# Patient Record
Sex: Female | Born: 1950 | Race: White | Hispanic: No | Marital: Single | Smoking: Never smoker
Health system: Southern US, Community
[De-identification: ages and names within clinical notes are randomized; demographics above are authoritative.]

## PROBLEM LIST (undated history)

## (undated) HISTORY — PX: BUNIONECTOMY: SHX129

## (undated) HISTORY — PX: GANGLION CYST EXCISION: SHX1691

## (undated) HISTORY — PX: LAMINECTOMY: SHX219

## (undated) HISTORY — PX: ABDOMINAL HYSTERECTOMY: SHX81

---

## 1999-09-25 ENCOUNTER — Other Ambulatory Visit: Admission: RE | Admit: 1999-09-25 | Discharge: 1999-09-25 | Payer: Self-pay | Admitting: Gynecology

## 2000-11-27 ENCOUNTER — Other Ambulatory Visit: Admission: RE | Admit: 2000-11-27 | Discharge: 2000-11-27 | Payer: Self-pay | Admitting: Gynecology

## 2001-11-30 ENCOUNTER — Other Ambulatory Visit: Admission: RE | Admit: 2001-11-30 | Discharge: 2001-11-30 | Payer: Self-pay | Admitting: Gynecology

## 2002-12-02 ENCOUNTER — Other Ambulatory Visit: Admission: RE | Admit: 2002-12-02 | Discharge: 2002-12-02 | Payer: Self-pay | Admitting: Gynecology

## 2003-12-06 ENCOUNTER — Other Ambulatory Visit: Admission: RE | Admit: 2003-12-06 | Discharge: 2003-12-06 | Payer: Self-pay | Admitting: Gynecology

## 2005-02-11 ENCOUNTER — Other Ambulatory Visit: Admission: RE | Admit: 2005-02-11 | Discharge: 2005-02-11 | Payer: Self-pay | Admitting: Gynecology

## 2005-09-16 ENCOUNTER — Ambulatory Visit (HOSPITAL_COMMUNITY): Admission: RE | Admit: 2005-09-16 | Discharge: 2005-09-17 | Payer: Self-pay | Admitting: Neurosurgery

## 2014-09-14 ENCOUNTER — Other Ambulatory Visit: Payer: Self-pay | Admitting: Unknown Physician Specialty

## 2014-09-14 DIAGNOSIS — Z139 Encounter for screening, unspecified: Secondary | ICD-10-CM

## 2014-09-14 DIAGNOSIS — K5900 Constipation, unspecified: Secondary | ICD-10-CM

## 2014-09-29 ENCOUNTER — Other Ambulatory Visit: Payer: Self-pay

## 2014-10-04 ENCOUNTER — Ambulatory Visit
Admission: RE | Admit: 2014-10-04 | Discharge: 2014-10-04 | Disposition: A | Discharge status: Home / Self Care | Payer: PRIVATE HEALTH INSURANCE | Source: Ambulatory Visit | Attending: Unknown Physician Specialty | Admitting: Unknown Physician Specialty

## 2014-10-04 DIAGNOSIS — K5900 Constipation, unspecified: Secondary | ICD-10-CM

## 2016-02-09 DIAGNOSIS — K219 Gastro-esophageal reflux disease without esophagitis: Secondary | ICD-10-CM

## 2016-02-09 DIAGNOSIS — E039 Hypothyroidism, unspecified: Secondary | ICD-10-CM

## 2016-02-09 DIAGNOSIS — K581 Irritable bowel syndrome with constipation: Secondary | ICD-10-CM

## 2016-02-09 DIAGNOSIS — M8580 Other specified disorders of bone density and structure, unspecified site: Secondary | ICD-10-CM

## 2016-02-09 DIAGNOSIS — F329 Major depressive disorder, single episode, unspecified: Secondary | ICD-10-CM

## 2016-02-09 DIAGNOSIS — I10 Essential (primary) hypertension: Secondary | ICD-10-CM

## 2016-02-09 HISTORY — DX: Gastro-esophageal reflux disease without esophagitis: K21.9

## 2016-02-09 HISTORY — DX: Major depressive disorder, single episode, unspecified: F32.9

## 2016-02-09 HISTORY — DX: Other specified disorders of bone density and structure, unspecified site: M85.80

## 2016-02-09 HISTORY — DX: Irritable bowel syndrome with constipation: K58.1

## 2016-02-09 HISTORY — DX: Essential (primary) hypertension: I10

## 2016-02-09 HISTORY — DX: Hypothyroidism, unspecified: E03.9

## 2016-04-05 DIAGNOSIS — I700 Atherosclerosis of aorta: Secondary | ICD-10-CM

## 2016-04-05 HISTORY — DX: Atherosclerosis of aorta: I70.0

## 2017-04-08 DIAGNOSIS — M67471 Ganglion, right ankle and foot: Secondary | ICD-10-CM

## 2017-04-08 HISTORY — DX: Ganglion, right ankle and foot: M67.471

## 2017-12-15 DIAGNOSIS — D485 Neoplasm of uncertain behavior of skin: Secondary | ICD-10-CM | POA: Insufficient documentation

## 2017-12-15 HISTORY — DX: Neoplasm of uncertain behavior of skin: D48.5

## 2018-02-02 DIAGNOSIS — I7789 Other specified disorders of arteries and arterioles: Secondary | ICD-10-CM

## 2018-02-02 DIAGNOSIS — Z0000 Encounter for general adult medical examination without abnormal findings: Secondary | ICD-10-CM | POA: Diagnosis not present

## 2018-02-02 DIAGNOSIS — R5383 Other fatigue: Secondary | ICD-10-CM | POA: Diagnosis not present

## 2018-02-02 DIAGNOSIS — K219 Gastro-esophageal reflux disease without esophagitis: Secondary | ICD-10-CM | POA: Diagnosis not present

## 2018-02-02 DIAGNOSIS — R5381 Other malaise: Secondary | ICD-10-CM | POA: Diagnosis not present

## 2018-02-02 DIAGNOSIS — I10 Essential (primary) hypertension: Secondary | ICD-10-CM | POA: Diagnosis not present

## 2018-02-02 DIAGNOSIS — K581 Irritable bowel syndrome with constipation: Secondary | ICD-10-CM | POA: Diagnosis not present

## 2018-02-02 DIAGNOSIS — I700 Atherosclerosis of aorta: Secondary | ICD-10-CM | POA: Diagnosis not present

## 2018-02-02 DIAGNOSIS — E039 Hypothyroidism, unspecified: Secondary | ICD-10-CM | POA: Diagnosis not present

## 2018-02-02 HISTORY — DX: Other specified disorders of arteries and arterioles: I77.89

## 2018-02-06 DIAGNOSIS — R8290 Unspecified abnormal findings in urine: Secondary | ICD-10-CM | POA: Diagnosis not present

## 2018-02-06 DIAGNOSIS — I700 Atherosclerosis of aorta: Secondary | ICD-10-CM | POA: Diagnosis not present

## 2018-02-17 DIAGNOSIS — M889 Osteitis deformans of unspecified bone: Secondary | ICD-10-CM

## 2018-02-17 HISTORY — DX: Osteitis deformans of unspecified bone: M88.9

## 2018-02-19 DIAGNOSIS — Z7982 Long term (current) use of aspirin: Secondary | ICD-10-CM

## 2018-02-19 DIAGNOSIS — E782 Mixed hyperlipidemia: Secondary | ICD-10-CM

## 2018-02-19 DIAGNOSIS — Z1231 Encounter for screening mammogram for malignant neoplasm of breast: Secondary | ICD-10-CM | POA: Diagnosis not present

## 2018-02-19 DIAGNOSIS — R079 Chest pain, unspecified: Secondary | ICD-10-CM | POA: Diagnosis not present

## 2018-02-19 DIAGNOSIS — I10 Essential (primary) hypertension: Secondary | ICD-10-CM | POA: Diagnosis not present

## 2018-02-19 HISTORY — DX: Long term (current) use of aspirin: Z79.82

## 2018-02-19 HISTORY — DX: Mixed hyperlipidemia: E78.2

## 2018-02-20 DIAGNOSIS — R079 Chest pain, unspecified: Secondary | ICD-10-CM | POA: Diagnosis not present

## 2018-03-03 DIAGNOSIS — Z7982 Long term (current) use of aspirin: Secondary | ICD-10-CM | POA: Diagnosis not present

## 2018-03-03 DIAGNOSIS — E782 Mixed hyperlipidemia: Secondary | ICD-10-CM | POA: Diagnosis not present

## 2018-03-03 DIAGNOSIS — R079 Chest pain, unspecified: Secondary | ICD-10-CM | POA: Diagnosis not present

## 2018-03-03 DIAGNOSIS — I10 Essential (primary) hypertension: Secondary | ICD-10-CM | POA: Diagnosis not present

## 2018-03-19 DIAGNOSIS — R079 Chest pain, unspecified: Secondary | ICD-10-CM | POA: Diagnosis not present

## 2018-03-19 DIAGNOSIS — Z7982 Long term (current) use of aspirin: Secondary | ICD-10-CM | POA: Diagnosis not present

## 2018-03-19 DIAGNOSIS — I10 Essential (primary) hypertension: Secondary | ICD-10-CM | POA: Diagnosis not present

## 2018-03-19 DIAGNOSIS — E782 Mixed hyperlipidemia: Secondary | ICD-10-CM | POA: Diagnosis not present

## 2018-03-26 DIAGNOSIS — M889 Osteitis deformans of unspecified bone: Secondary | ICD-10-CM | POA: Diagnosis not present

## 2018-03-26 DIAGNOSIS — E559 Vitamin D deficiency, unspecified: Secondary | ICD-10-CM | POA: Diagnosis not present

## 2018-03-26 DIAGNOSIS — R5383 Other fatigue: Secondary | ICD-10-CM | POA: Diagnosis not present

## 2018-03-26 DIAGNOSIS — M810 Age-related osteoporosis without current pathological fracture: Secondary | ICD-10-CM | POA: Diagnosis not present

## 2018-04-01 DIAGNOSIS — M881 Osteitis deformans of vertebrae: Secondary | ICD-10-CM | POA: Diagnosis not present

## 2018-04-01 DIAGNOSIS — M8580 Other specified disorders of bone density and structure, unspecified site: Secondary | ICD-10-CM | POA: Diagnosis not present

## 2018-04-01 DIAGNOSIS — M889 Osteitis deformans of unspecified bone: Secondary | ICD-10-CM | POA: Diagnosis not present

## 2018-04-01 DIAGNOSIS — R937 Abnormal findings on diagnostic imaging of other parts of musculoskeletal system: Secondary | ICD-10-CM | POA: Diagnosis not present

## 2018-07-09 DIAGNOSIS — E039 Hypothyroidism, unspecified: Secondary | ICD-10-CM | POA: Diagnosis not present

## 2018-07-09 DIAGNOSIS — I10 Essential (primary) hypertension: Secondary | ICD-10-CM | POA: Diagnosis not present

## 2018-07-09 DIAGNOSIS — K219 Gastro-esophageal reflux disease without esophagitis: Secondary | ICD-10-CM | POA: Diagnosis not present

## 2018-07-09 DIAGNOSIS — M8589 Other specified disorders of bone density and structure, multiple sites: Secondary | ICD-10-CM | POA: Diagnosis not present

## 2018-07-27 DIAGNOSIS — E041 Nontoxic single thyroid nodule: Secondary | ICD-10-CM | POA: Diagnosis not present

## 2018-07-27 DIAGNOSIS — E2839 Other primary ovarian failure: Secondary | ICD-10-CM | POA: Diagnosis not present

## 2018-07-27 DIAGNOSIS — M85851 Other specified disorders of bone density and structure, right thigh: Secondary | ICD-10-CM | POA: Diagnosis not present

## 2018-07-27 DIAGNOSIS — E039 Hypothyroidism, unspecified: Secondary | ICD-10-CM | POA: Diagnosis not present

## 2018-08-06 ENCOUNTER — Other Ambulatory Visit: Payer: Self-pay | Admitting: *Deleted

## 2018-08-06 NOTE — Patient Outreach (Signed)
[place] [facility]) Care Management  08/06/2018  ARMONEE BOJANOWSKI Nov 18, 1951 [id number]  Initial unsuccessful outreach to member to complete Health [employer] health risk assessment screening. Left message on home contact number requesting return call.   Barrington Ellison RN,CCM,CDE [place] Management Coordinator Office Phone [phone] Office Fax [phone]

## 2018-08-07 ENCOUNTER — Other Ambulatory Visit: Payer: Self-pay | Admitting: *Deleted

## 2018-08-07 NOTE — Patient Outreach (Signed)
[place] [facility]) Care Management  08/07/2018  Wendy Diaz 01/07/1951 [id number]   Returned call to member in attempt to complete [employer] Heath risk assessment, no answer and not able to leave voice mail at number provided by member.  Barrington Ellison RN,CCM,CDE [place] Management Coordinator Office Phone [phone] Office Fax [phone]

## 2018-08-07 NOTE — Patient Outreach (Signed)
[place] [facility]) Care Management  08/07/2018  Wendy Diaz 1951-06-07 [id number]   Member returned call and [facility] Team [employer]age health risk assessment answers reviewed with [place]. She states she was recently diagnosed with hypertension. She reports chronic fatigue since starting hypertension medications of amlodipine and losartan in March.  She also states she had to change to generic levothyroxine to treat her chronic hypothyroidism because she could not afford the copay for Synthroid under her current [employer] plan. She wonders if the change to generic thyroid medication is contributing to her fatigue even though her most recent TSH was normal.  She states she monitors her blood pressure at least daily and sometimes twice daily. She provided 3 of the most recent readings; 134/71, pulse = 59, 131/70, pulse 69 and 130/7- pulse 56. Encouraged her to contact her provider to discuss change in therapy.  She also says she has never been told her blood pressure targets. She declined th offer of a health coach for HTN at this time.  She says she has chronic vertigo and she thinks the blood pressure medicines have exacerbated the vertigo.  She also relates chronic bladder incontinence  that has worsened since her hysterectomy. She says she had a bladder tack years ago and that did not help. She says she wears incontinence briefs.  She says she thinks she may be depressed as her Dad died and she recently placed her Mom in a facility due to Alzheimer's Disease. She says she would like information about the licensed clinical social worker Warden/ranger) services provided by [employer]. Verified home address; will send successful outreach letter with [place] Management program brochure and 24 hr Nurse Line magnet and Emmi modules on hypertension and checking your blood pressure at home and blood pressure medicines and depression. Will also send  information on percutaneous tibial nerve stimulation to treat urinary incontinence.    Barrington Ellison RN,CCM,CDE [place] Management Coordinator Office Phone [phone] Office Fax [phone]

## 2018-08-07 NOTE — Patient Outreach (Signed)
[place] [facility]) Care Management  08/07/2018  Wendy Diaz [birth year] [id number]   Patient returned call in response to phone message left on 8/15 by this RNCM. She left voice mail requesting return call.  Barrington Ellison RN,CCM,CDE [place] Management Coordinator Office Phone [phone] Office Fax [phone]

## 2018-10-21 DIAGNOSIS — R42 Dizziness and giddiness: Secondary | ICD-10-CM | POA: Diagnosis not present

## 2018-10-21 DIAGNOSIS — J209 Acute bronchitis, unspecified: Secondary | ICD-10-CM | POA: Diagnosis not present

## 2018-10-21 DIAGNOSIS — H8190 Unspecified disorder of vestibular function, unspecified ear: Secondary | ICD-10-CM

## 2018-10-21 HISTORY — DX: Unspecified disorder of vestibular function, unspecified ear: H81.90

## 2018-11-24 DIAGNOSIS — E039 Hypothyroidism, unspecified: Secondary | ICD-10-CM | POA: Diagnosis not present

## 2018-11-24 DIAGNOSIS — R42 Dizziness and giddiness: Secondary | ICD-10-CM | POA: Diagnosis not present

## 2018-11-24 DIAGNOSIS — F329 Major depressive disorder, single episode, unspecified: Secondary | ICD-10-CM | POA: Diagnosis not present

## 2018-11-24 DIAGNOSIS — E782 Mixed hyperlipidemia: Secondary | ICD-10-CM | POA: Diagnosis not present

## 2018-11-24 DIAGNOSIS — Z0000 Encounter for general adult medical examination without abnormal findings: Secondary | ICD-10-CM | POA: Diagnosis not present

## 2018-11-24 DIAGNOSIS — M8589 Other specified disorders of bone density and structure, multiple sites: Secondary | ICD-10-CM | POA: Diagnosis not present

## 2018-11-24 DIAGNOSIS — K219 Gastro-esophageal reflux disease without esophagitis: Secondary | ICD-10-CM | POA: Diagnosis not present

## 2018-11-24 DIAGNOSIS — I700 Atherosclerosis of aorta: Secondary | ICD-10-CM | POA: Diagnosis not present

## 2018-11-24 DIAGNOSIS — I10 Essential (primary) hypertension: Secondary | ICD-10-CM | POA: Diagnosis not present

## 2018-11-24 DIAGNOSIS — I7789 Other specified disorders of arteries and arterioles: Secondary | ICD-10-CM | POA: Diagnosis not present

## 2018-11-24 DIAGNOSIS — K581 Irritable bowel syndrome with constipation: Secondary | ICD-10-CM | POA: Diagnosis not present

## 2018-11-24 DIAGNOSIS — Z7982 Long term (current) use of aspirin: Secondary | ICD-10-CM | POA: Diagnosis not present

## 2018-11-24 DIAGNOSIS — E7800 Pure hypercholesterolemia, unspecified: Secondary | ICD-10-CM | POA: Diagnosis not present

## 2018-12-08 DIAGNOSIS — M545 Low back pain: Secondary | ICD-10-CM | POA: Diagnosis not present

## 2018-12-08 DIAGNOSIS — E559 Vitamin D deficiency, unspecified: Secondary | ICD-10-CM | POA: Diagnosis not present

## 2018-12-08 DIAGNOSIS — M889 Osteitis deformans of unspecified bone: Secondary | ICD-10-CM | POA: Diagnosis not present

## 2019-01-07 DIAGNOSIS — R42 Dizziness and giddiness: Secondary | ICD-10-CM | POA: Diagnosis not present

## 2019-01-07 DIAGNOSIS — H6121 Impacted cerumen, right ear: Secondary | ICD-10-CM | POA: Diagnosis not present

## 2019-01-07 DIAGNOSIS — J342 Deviated nasal septum: Secondary | ICD-10-CM | POA: Diagnosis not present

## 2019-01-07 DIAGNOSIS — H8110 Benign paroxysmal vertigo, unspecified ear: Secondary | ICD-10-CM | POA: Diagnosis not present

## 2019-03-08 DIAGNOSIS — R2681 Unsteadiness on feet: Secondary | ICD-10-CM | POA: Diagnosis not present

## 2019-03-08 DIAGNOSIS — R42 Dizziness and giddiness: Secondary | ICD-10-CM | POA: Diagnosis not present

## 2019-03-11 DIAGNOSIS — R2681 Unsteadiness on feet: Secondary | ICD-10-CM | POA: Diagnosis not present

## 2019-03-11 DIAGNOSIS — R42 Dizziness and giddiness: Secondary | ICD-10-CM | POA: Diagnosis not present

## 2019-03-18 DIAGNOSIS — R2681 Unsteadiness on feet: Secondary | ICD-10-CM | POA: Diagnosis not present

## 2019-03-18 DIAGNOSIS — R42 Dizziness and giddiness: Secondary | ICD-10-CM | POA: Diagnosis not present

## 2019-03-25 DIAGNOSIS — R2681 Unsteadiness on feet: Secondary | ICD-10-CM | POA: Diagnosis not present

## 2019-03-25 DIAGNOSIS — R42 Dizziness and giddiness: Secondary | ICD-10-CM | POA: Diagnosis not present

## 2019-04-08 DIAGNOSIS — R2681 Unsteadiness on feet: Secondary | ICD-10-CM | POA: Diagnosis not present

## 2019-04-08 DIAGNOSIS — R42 Dizziness and giddiness: Secondary | ICD-10-CM | POA: Diagnosis not present

## 2019-07-19 DIAGNOSIS — I10 Essential (primary) hypertension: Secondary | ICD-10-CM | POA: Diagnosis not present

## 2019-07-19 DIAGNOSIS — F329 Major depressive disorder, single episode, unspecified: Secondary | ICD-10-CM | POA: Diagnosis not present

## 2019-07-19 DIAGNOSIS — R7303 Prediabetes: Secondary | ICD-10-CM | POA: Diagnosis not present

## 2019-07-19 DIAGNOSIS — I700 Atherosclerosis of aorta: Secondary | ICD-10-CM | POA: Diagnosis not present

## 2019-07-19 DIAGNOSIS — Z7982 Long term (current) use of aspirin: Secondary | ICD-10-CM | POA: Diagnosis not present

## 2019-07-19 DIAGNOSIS — I7789 Other specified disorders of arteries and arterioles: Secondary | ICD-10-CM | POA: Diagnosis not present

## 2019-07-19 DIAGNOSIS — M8589 Other specified disorders of bone density and structure, multiple sites: Secondary | ICD-10-CM | POA: Diagnosis not present

## 2019-07-19 DIAGNOSIS — K219 Gastro-esophageal reflux disease without esophagitis: Secondary | ICD-10-CM | POA: Diagnosis not present

## 2019-07-19 DIAGNOSIS — K581 Irritable bowel syndrome with constipation: Secondary | ICD-10-CM | POA: Diagnosis not present

## 2019-07-19 DIAGNOSIS — E039 Hypothyroidism, unspecified: Secondary | ICD-10-CM | POA: Diagnosis not present

## 2019-07-19 DIAGNOSIS — E782 Mixed hyperlipidemia: Secondary | ICD-10-CM | POA: Diagnosis not present

## 2019-10-26 DIAGNOSIS — K5904 Chronic idiopathic constipation: Secondary | ICD-10-CM | POA: Diagnosis not present

## 2019-10-26 DIAGNOSIS — Z800 Family history of malignant neoplasm of digestive organs: Secondary | ICD-10-CM | POA: Diagnosis not present

## 2019-11-10 ENCOUNTER — Other Ambulatory Visit: Payer: Self-pay | Admitting: Unknown Physician Specialty

## 2019-11-10 DIAGNOSIS — Z800 Family history of malignant neoplasm of digestive organs: Secondary | ICD-10-CM

## 2019-11-26 DIAGNOSIS — K581 Irritable bowel syndrome with constipation: Secondary | ICD-10-CM | POA: Diagnosis not present

## 2019-11-26 DIAGNOSIS — E782 Mixed hyperlipidemia: Secondary | ICD-10-CM | POA: Diagnosis not present

## 2019-11-26 DIAGNOSIS — Z7982 Long term (current) use of aspirin: Secondary | ICD-10-CM | POA: Diagnosis not present

## 2019-11-26 DIAGNOSIS — Z0000 Encounter for general adult medical examination without abnormal findings: Secondary | ICD-10-CM | POA: Diagnosis not present

## 2019-11-26 DIAGNOSIS — F329 Major depressive disorder, single episode, unspecified: Secondary | ICD-10-CM | POA: Diagnosis not present

## 2019-11-26 DIAGNOSIS — I7789 Other specified disorders of arteries and arterioles: Secondary | ICD-10-CM | POA: Diagnosis not present

## 2019-11-26 DIAGNOSIS — I700 Atherosclerosis of aorta: Secondary | ICD-10-CM | POA: Diagnosis not present

## 2019-11-26 DIAGNOSIS — E039 Hypothyroidism, unspecified: Secondary | ICD-10-CM | POA: Diagnosis not present

## 2019-11-26 DIAGNOSIS — K219 Gastro-esophageal reflux disease without esophagitis: Secondary | ICD-10-CM | POA: Diagnosis not present

## 2019-11-26 DIAGNOSIS — I10 Essential (primary) hypertension: Secondary | ICD-10-CM | POA: Diagnosis not present

## 2019-11-30 ENCOUNTER — Ambulatory Visit
Admission: RE | Admit: 2019-11-30 | Discharge: 2019-11-30 | Disposition: A | Discharge status: Home / Self Care | Payer: PPO | Source: Ambulatory Visit | Attending: Unknown Physician Specialty | Admitting: Unknown Physician Specialty

## 2019-11-30 DIAGNOSIS — Z800 Family history of malignant neoplasm of digestive organs: Secondary | ICD-10-CM

## 2019-11-30 DIAGNOSIS — K8020 Calculus of gallbladder without cholecystitis without obstruction: Secondary | ICD-10-CM | POA: Diagnosis not present

## 2020-01-21 DIAGNOSIS — Z1231 Encounter for screening mammogram for malignant neoplasm of breast: Secondary | ICD-10-CM | POA: Diagnosis not present

## 2020-06-19 DIAGNOSIS — K581 Irritable bowel syndrome with constipation: Secondary | ICD-10-CM | POA: Diagnosis not present

## 2020-06-19 DIAGNOSIS — I700 Atherosclerosis of aorta: Secondary | ICD-10-CM | POA: Diagnosis not present

## 2020-06-19 DIAGNOSIS — Z7982 Long term (current) use of aspirin: Secondary | ICD-10-CM | POA: Diagnosis not present

## 2020-06-19 DIAGNOSIS — E039 Hypothyroidism, unspecified: Secondary | ICD-10-CM | POA: Diagnosis not present

## 2020-06-19 DIAGNOSIS — E782 Mixed hyperlipidemia: Secondary | ICD-10-CM | POA: Diagnosis not present

## 2020-06-19 DIAGNOSIS — K219 Gastro-esophageal reflux disease without esophagitis: Secondary | ICD-10-CM | POA: Diagnosis not present

## 2020-06-19 DIAGNOSIS — F329 Major depressive disorder, single episode, unspecified: Secondary | ICD-10-CM | POA: Diagnosis not present

## 2020-06-19 DIAGNOSIS — I10 Essential (primary) hypertension: Secondary | ICD-10-CM | POA: Diagnosis not present

## 2020-06-19 DIAGNOSIS — M25552 Pain in left hip: Secondary | ICD-10-CM | POA: Diagnosis not present

## 2020-06-20 DIAGNOSIS — H903 Sensorineural hearing loss, bilateral: Secondary | ICD-10-CM | POA: Diagnosis not present

## 2020-07-03 DIAGNOSIS — N3091 Cystitis, unspecified with hematuria: Secondary | ICD-10-CM | POA: Diagnosis not present

## 2020-07-03 DIAGNOSIS — N3000 Acute cystitis without hematuria: Secondary | ICD-10-CM | POA: Diagnosis not present

## 2020-07-04 DIAGNOSIS — H903 Sensorineural hearing loss, bilateral: Secondary | ICD-10-CM | POA: Diagnosis not present

## 2020-07-06 DIAGNOSIS — M160 Bilateral primary osteoarthritis of hip: Secondary | ICD-10-CM | POA: Diagnosis not present

## 2020-07-06 DIAGNOSIS — M7062 Trochanteric bursitis, left hip: Secondary | ICD-10-CM

## 2020-07-06 DIAGNOSIS — M47818 Spondylosis without myelopathy or radiculopathy, sacral and sacrococcygeal region: Secondary | ICD-10-CM | POA: Diagnosis not present

## 2020-07-06 DIAGNOSIS — M533 Sacrococcygeal disorders, not elsewhere classified: Secondary | ICD-10-CM | POA: Diagnosis not present

## 2020-07-06 HISTORY — DX: Trochanteric bursitis, left hip: M70.62

## 2020-07-28 DIAGNOSIS — Z780 Asymptomatic menopausal state: Secondary | ICD-10-CM | POA: Diagnosis not present

## 2020-07-28 DIAGNOSIS — M8589 Other specified disorders of bone density and structure, multiple sites: Secondary | ICD-10-CM | POA: Diagnosis not present

## 2020-08-29 DIAGNOSIS — J0100 Acute maxillary sinusitis, unspecified: Secondary | ICD-10-CM | POA: Diagnosis not present

## 2020-08-29 DIAGNOSIS — J309 Allergic rhinitis, unspecified: Secondary | ICD-10-CM | POA: Diagnosis not present

## 2020-09-14 IMAGING — CT CT VIRTUAL COLONOSCOPY SCREENING
2 of 9 series · 12 of 46 positions shown, 18 images · non-contrast
Comparison: 10/04/2049

CLINICAL DATA: Screening. Family history of colon cancer. Prior
hysterectomy.

EXAM:
CT VIRTUAL COLONOSCOPY SCREENING
TECHNIQUE: The patient was given a standard bowel preparation with Gastrografin
and barium for fluid and stool tagging respectively. The quality of
the bowel preparation is good. Automated CO2 insufflation of the
colon was performed prior to image acquisition and colonic
distention is moderate. Image post processing was used to generate a
3D endoluminal fly-through projection of the colon and to
electronically subtract stool/fluid as appropriate.

[Series 11: prone colon 1.50 br40 s3 prone thin · axial · 0.80mm/px · z∈[+1150,+1546]mm · 9 of 332 slices shown, 15 images]
[im 34/332  soft-tissue]
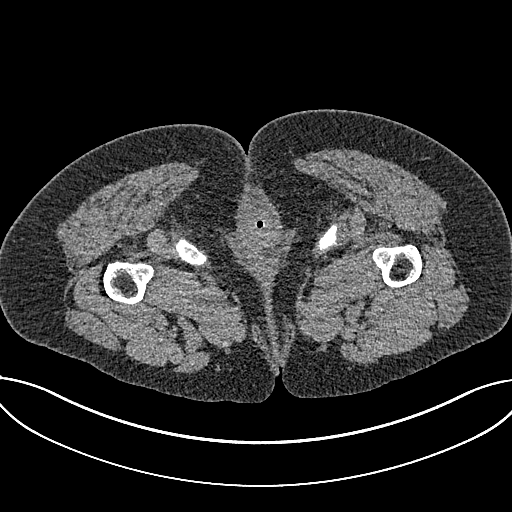
[im 34/332  bone]
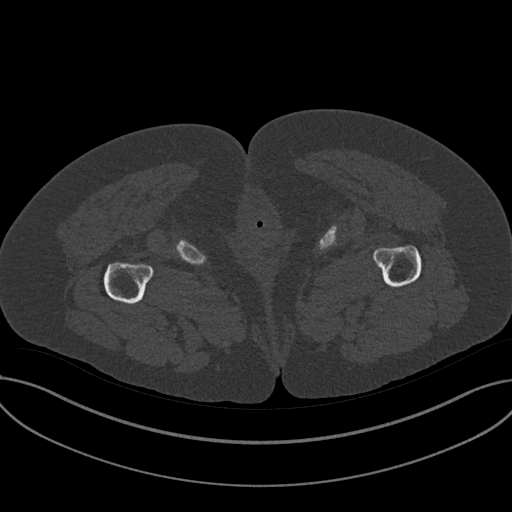
[im 67/332  soft-tissue]
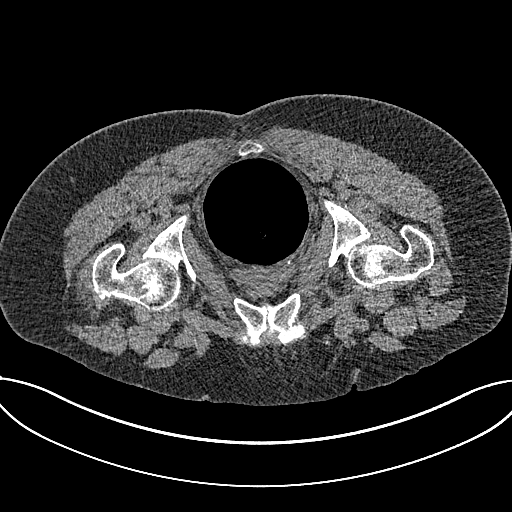
[im 100/332  soft-tissue]
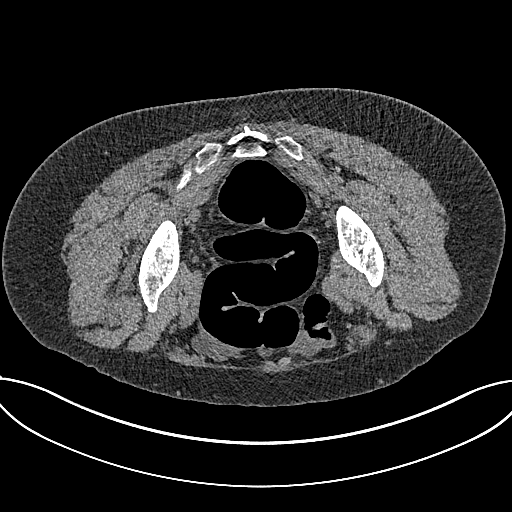
[im 133/332  soft-tissue]
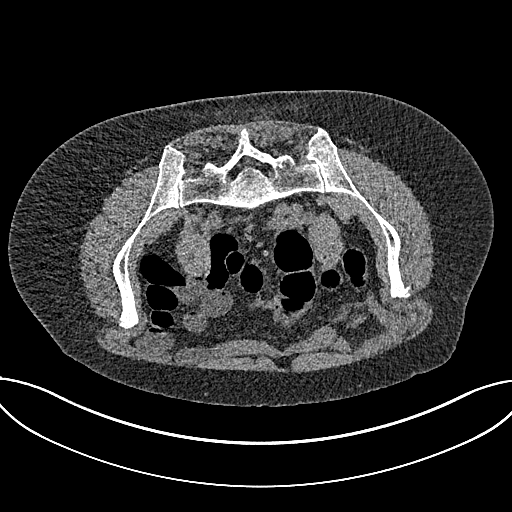
[im 166/332  soft-tissue]
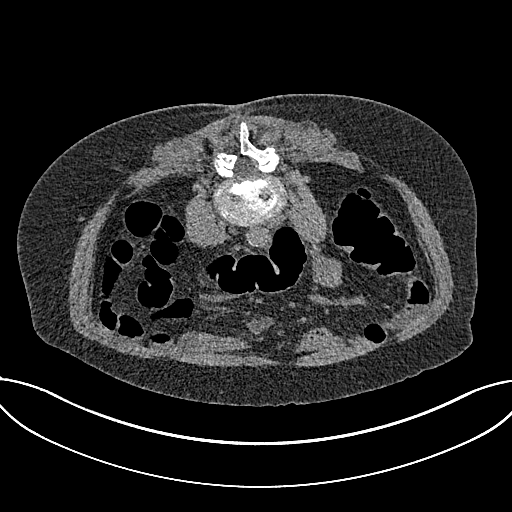
[im 199/332  soft-tissue]
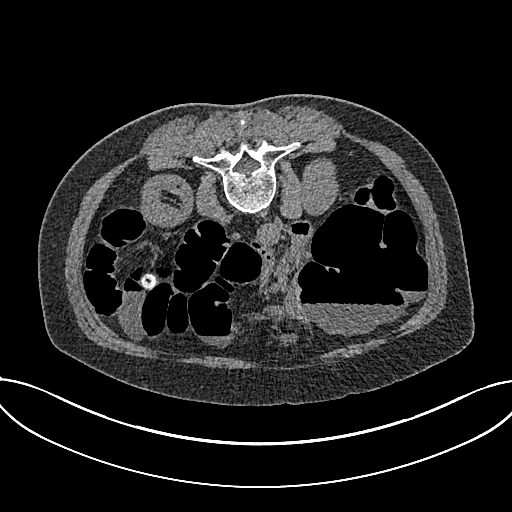
[im 199/332  lung]
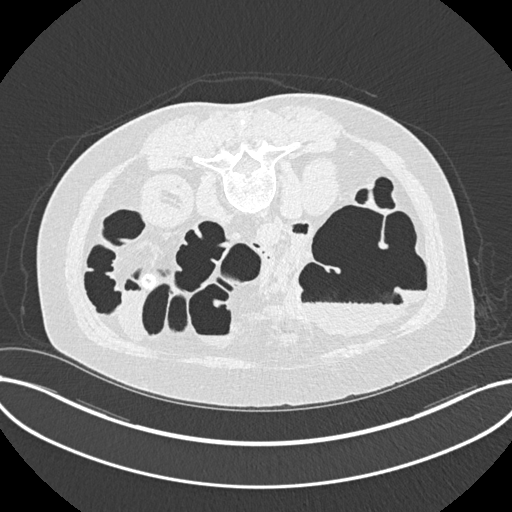
[im 232/332  soft-tissue]
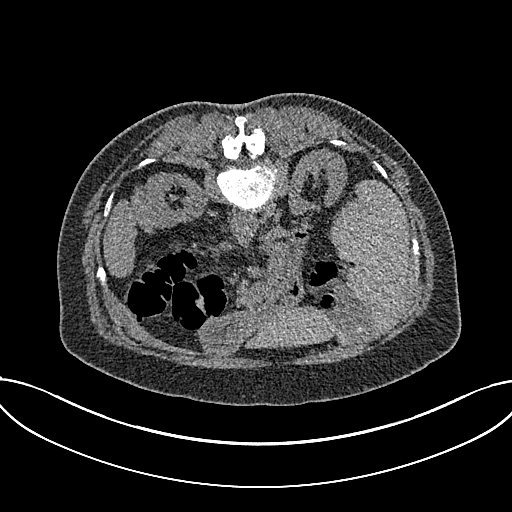
[im 232/332  lung]
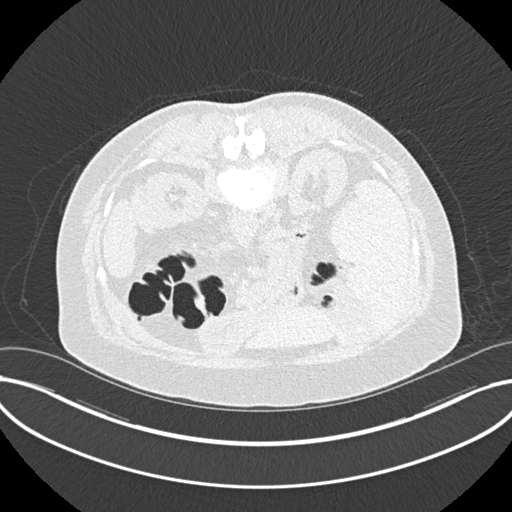
[im 265/332  soft-tissue]
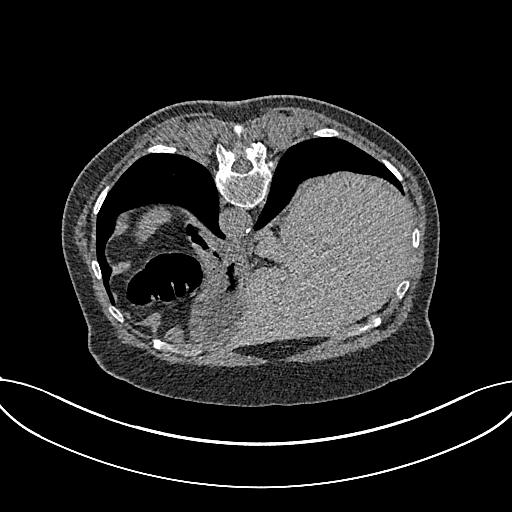
[im 265/332  lung]
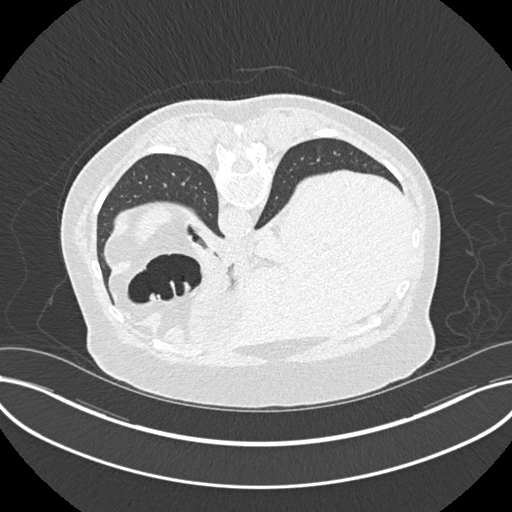
[im 298/332  soft-tissue]
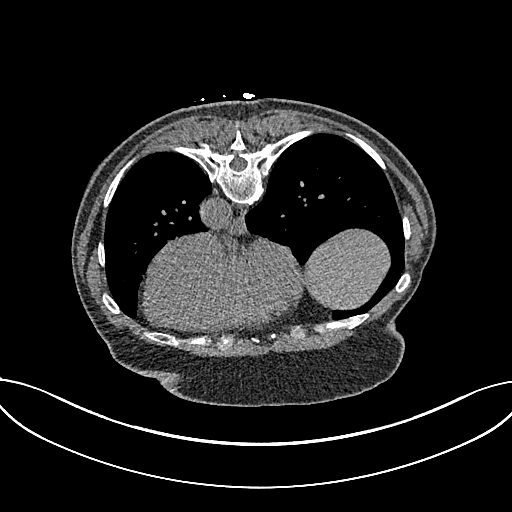
[im 298/332  lung]
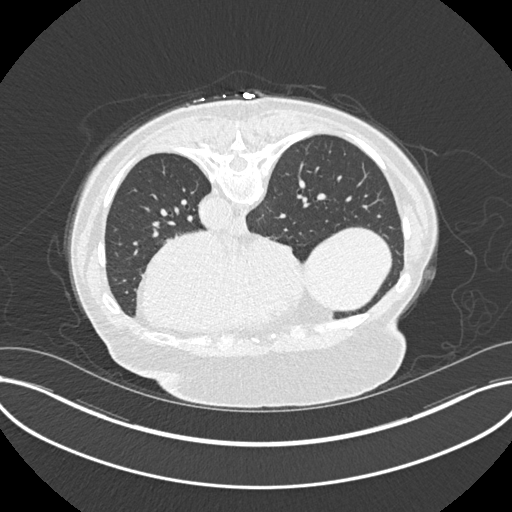
[im 298/332  bone]
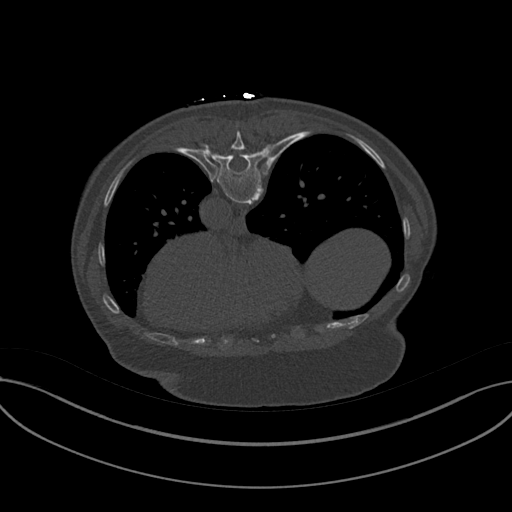

[Series 13: prone colon 3.00 br40 s3 cor prone · coronal · 0.80mm/px · 3 of 86 slices shown]
[im 22/86  soft-tissue]
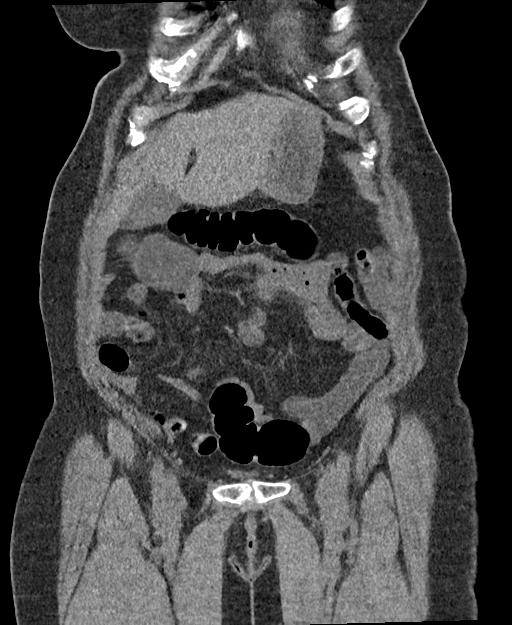
[im 43/86  soft-tissue]
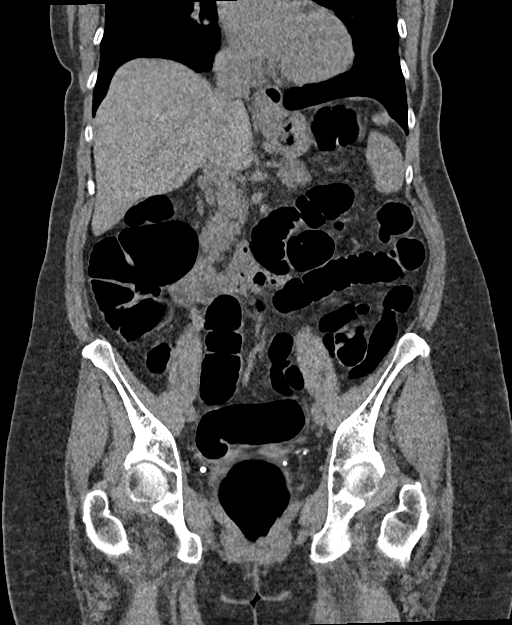
[im 64/86  soft-tissue]
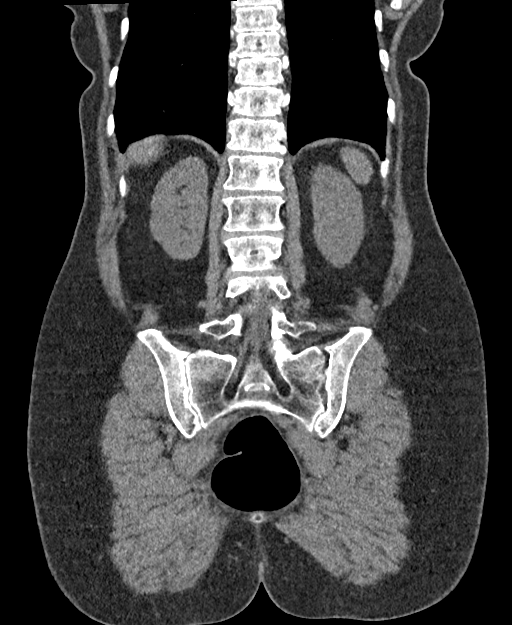

[12 of 46 positions shown; findings below may reference images not displayed]

FINDINGS: VIRTUAL COLONOSCOPY

The colon is again extremely tortuous and redundant. Portions of the
colon are suboptimally distended, primarily involving the left colon
on supine imaging in the right colon on prone imaging. Extensive
colonic diverticulosis. Isolated mobile tagged stool within the left
colon including on 117/4. Small amount of retained fluid.

No evidence of clinically significant colonic polyp or mass.

Virtual colonoscopy is not designed to detect diminutive polyps
(i.e., less than or equal to 5 mm), the presence or absence of which
may not affect clinical management.

CT ABDOMEN AND PELVIS WITHOUT CONTRAST

Lower chest: Clear lung bases. Normal heart size without pericardial
or pleural effusion.

Hepatobiliary: Normal liver. Small gallstones without acute
cholecystitis or biliary duct dilatation.

Pancreas: Normal, without mass or ductal dilatation.

Spleen: Normal in size, without focal abnormality.

Adrenals/Urinary Tract: Normal adrenal glands. Normal adrenal
glands. No renal calculi or hydronephrosis. No hydroureter or
ureteric calculi. No bladder calculi.

Stomach/Bowel: Normal stomach, without wall thickening. Normal small
bowel. Normal appendix.

Vascular/Lymphatic: Aortic and branch vessel atherosclerosis. No
abdominopelvic adenopathy.

Reproductive: Hysterectomy.  No adnexal mass.

Other: No significant free fluid.  No free intraperitoneal air.

Musculoskeletal: Lumbar spondylosis.
IMPRESSION: 1. No evidence of clinically significant colonic polyp or mass.
2. Cholelithiasis.
3.  Aortic Atherosclerosis (0M0S8-CPR.R).

## 2020-12-09 DIAGNOSIS — R051 Acute cough: Secondary | ICD-10-CM | POA: Diagnosis not present

## 2020-12-09 DIAGNOSIS — J00 Acute nasopharyngitis [common cold]: Secondary | ICD-10-CM | POA: Diagnosis not present

## 2021-02-15 DIAGNOSIS — Z0000 Encounter for general adult medical examination without abnormal findings: Secondary | ICD-10-CM | POA: Diagnosis not present

## 2021-02-15 DIAGNOSIS — E039 Hypothyroidism, unspecified: Secondary | ICD-10-CM | POA: Diagnosis not present

## 2021-02-15 DIAGNOSIS — I10 Essential (primary) hypertension: Secondary | ICD-10-CM | POA: Diagnosis not present

## 2021-02-15 DIAGNOSIS — K219 Gastro-esophageal reflux disease without esophagitis: Secondary | ICD-10-CM | POA: Diagnosis not present

## 2021-02-15 DIAGNOSIS — Z7982 Long term (current) use of aspirin: Secondary | ICD-10-CM | POA: Diagnosis not present

## 2021-02-15 DIAGNOSIS — M8589 Other specified disorders of bone density and structure, multiple sites: Secondary | ICD-10-CM | POA: Diagnosis not present

## 2021-02-15 DIAGNOSIS — I700 Atherosclerosis of aorta: Secondary | ICD-10-CM | POA: Diagnosis not present

## 2021-02-15 DIAGNOSIS — I7789 Other specified disorders of arteries and arterioles: Secondary | ICD-10-CM | POA: Diagnosis not present

## 2021-02-15 DIAGNOSIS — H8190 Unspecified disorder of vestibular function, unspecified ear: Secondary | ICD-10-CM | POA: Diagnosis not present

## 2021-02-15 DIAGNOSIS — K581 Irritable bowel syndrome with constipation: Secondary | ICD-10-CM | POA: Diagnosis not present

## 2021-02-15 DIAGNOSIS — E782 Mixed hyperlipidemia: Secondary | ICD-10-CM | POA: Diagnosis not present

## 2021-02-15 DIAGNOSIS — F329 Major depressive disorder, single episode, unspecified: Secondary | ICD-10-CM | POA: Diagnosis not present

## 2021-07-12 DIAGNOSIS — Z1231 Encounter for screening mammogram for malignant neoplasm of breast: Secondary | ICD-10-CM | POA: Diagnosis not present

## 2021-07-13 DIAGNOSIS — R921 Mammographic calcification found on diagnostic imaging of breast: Secondary | ICD-10-CM

## 2021-07-13 HISTORY — DX: Mammographic calcification found on diagnostic imaging of breast: R92.1

## 2021-08-09 DIAGNOSIS — R922 Inconclusive mammogram: Secondary | ICD-10-CM | POA: Diagnosis not present

## 2021-08-09 DIAGNOSIS — R928 Other abnormal and inconclusive findings on diagnostic imaging of breast: Secondary | ICD-10-CM | POA: Diagnosis not present

## 2021-08-09 DIAGNOSIS — R921 Mammographic calcification found on diagnostic imaging of breast: Secondary | ICD-10-CM | POA: Diagnosis not present

## 2021-08-30 DIAGNOSIS — M7062 Trochanteric bursitis, left hip: Secondary | ICD-10-CM | POA: Diagnosis not present

## 2021-08-30 DIAGNOSIS — K219 Gastro-esophageal reflux disease without esophagitis: Secondary | ICD-10-CM | POA: Diagnosis not present

## 2021-08-30 DIAGNOSIS — M889 Osteitis deformans of unspecified bone: Secondary | ICD-10-CM | POA: Diagnosis not present

## 2021-08-30 DIAGNOSIS — Z23 Encounter for immunization: Secondary | ICD-10-CM | POA: Diagnosis not present

## 2021-08-30 DIAGNOSIS — I7789 Other specified disorders of arteries and arterioles: Secondary | ICD-10-CM | POA: Diagnosis not present

## 2021-08-30 DIAGNOSIS — I10 Essential (primary) hypertension: Secondary | ICD-10-CM | POA: Diagnosis not present

## 2021-08-30 DIAGNOSIS — Z0000 Encounter for general adult medical examination without abnormal findings: Secondary | ICD-10-CM | POA: Diagnosis not present

## 2021-08-30 DIAGNOSIS — E782 Mixed hyperlipidemia: Secondary | ICD-10-CM | POA: Diagnosis not present

## 2021-08-30 DIAGNOSIS — E039 Hypothyroidism, unspecified: Secondary | ICD-10-CM | POA: Diagnosis not present

## 2021-08-30 DIAGNOSIS — M8589 Other specified disorders of bone density and structure, multiple sites: Secondary | ICD-10-CM | POA: Diagnosis not present

## 2021-08-30 DIAGNOSIS — K581 Irritable bowel syndrome with constipation: Secondary | ICD-10-CM | POA: Diagnosis not present

## 2021-08-30 DIAGNOSIS — I700 Atherosclerosis of aorta: Secondary | ICD-10-CM | POA: Diagnosis not present

## 2021-08-30 DIAGNOSIS — F329 Major depressive disorder, single episode, unspecified: Secondary | ICD-10-CM | POA: Diagnosis not present

## 2021-08-31 DIAGNOSIS — K581 Irritable bowel syndrome with constipation: Secondary | ICD-10-CM | POA: Diagnosis not present

## 2021-08-31 DIAGNOSIS — M8580 Other specified disorders of bone density and structure, unspecified site: Secondary | ICD-10-CM | POA: Diagnosis not present

## 2021-08-31 DIAGNOSIS — R42 Dizziness and giddiness: Secondary | ICD-10-CM | POA: Diagnosis not present

## 2021-08-31 DIAGNOSIS — K219 Gastro-esophageal reflux disease without esophagitis: Secondary | ICD-10-CM | POA: Diagnosis not present

## 2021-08-31 DIAGNOSIS — Z0000 Encounter for general adult medical examination without abnormal findings: Secondary | ICD-10-CM | POA: Diagnosis not present

## 2021-08-31 DIAGNOSIS — I10 Essential (primary) hypertension: Secondary | ICD-10-CM | POA: Diagnosis not present

## 2021-08-31 DIAGNOSIS — Z7982 Long term (current) use of aspirin: Secondary | ICD-10-CM | POA: Diagnosis not present

## 2021-08-31 DIAGNOSIS — E782 Mixed hyperlipidemia: Secondary | ICD-10-CM | POA: Diagnosis not present

## 2021-08-31 DIAGNOSIS — M889 Osteitis deformans of unspecified bone: Secondary | ICD-10-CM | POA: Diagnosis not present

## 2021-08-31 DIAGNOSIS — I700 Atherosclerosis of aorta: Secondary | ICD-10-CM | POA: Diagnosis not present

## 2021-08-31 DIAGNOSIS — F329 Major depressive disorder, single episode, unspecified: Secondary | ICD-10-CM | POA: Diagnosis not present

## 2021-08-31 DIAGNOSIS — E039 Hypothyroidism, unspecified: Secondary | ICD-10-CM | POA: Diagnosis not present

## 2021-09-30 DIAGNOSIS — H8110 Benign paroxysmal vertigo, unspecified ear: Secondary | ICD-10-CM | POA: Diagnosis not present

## 2021-10-03 DIAGNOSIS — R262 Difficulty in walking, not elsewhere classified: Secondary | ICD-10-CM | POA: Diagnosis not present

## 2021-10-03 DIAGNOSIS — R42 Dizziness and giddiness: Secondary | ICD-10-CM | POA: Diagnosis not present

## 2021-10-03 DIAGNOSIS — H8112 Benign paroxysmal vertigo, left ear: Secondary | ICD-10-CM | POA: Diagnosis not present

## 2021-10-05 DIAGNOSIS — R42 Dizziness and giddiness: Secondary | ICD-10-CM | POA: Diagnosis not present

## 2021-10-05 DIAGNOSIS — H8112 Benign paroxysmal vertigo, left ear: Secondary | ICD-10-CM | POA: Diagnosis not present

## 2021-10-05 DIAGNOSIS — R262 Difficulty in walking, not elsewhere classified: Secondary | ICD-10-CM | POA: Diagnosis not present

## 2021-10-08 DIAGNOSIS — H8112 Benign paroxysmal vertigo, left ear: Secondary | ICD-10-CM | POA: Diagnosis not present

## 2021-10-08 DIAGNOSIS — R262 Difficulty in walking, not elsewhere classified: Secondary | ICD-10-CM | POA: Diagnosis not present

## 2021-10-08 DIAGNOSIS — R42 Dizziness and giddiness: Secondary | ICD-10-CM | POA: Diagnosis not present

## 2021-10-10 DIAGNOSIS — R262 Difficulty in walking, not elsewhere classified: Secondary | ICD-10-CM | POA: Diagnosis not present

## 2021-10-10 DIAGNOSIS — H8112 Benign paroxysmal vertigo, left ear: Secondary | ICD-10-CM | POA: Diagnosis not present

## 2021-10-10 DIAGNOSIS — R42 Dizziness and giddiness: Secondary | ICD-10-CM | POA: Diagnosis not present

## 2021-10-17 DIAGNOSIS — R42 Dizziness and giddiness: Secondary | ICD-10-CM | POA: Diagnosis not present

## 2021-10-17 DIAGNOSIS — H8112 Benign paroxysmal vertigo, left ear: Secondary | ICD-10-CM | POA: Diagnosis not present

## 2021-10-17 DIAGNOSIS — R262 Difficulty in walking, not elsewhere classified: Secondary | ICD-10-CM | POA: Diagnosis not present

## 2021-10-23 DIAGNOSIS — H8112 Benign paroxysmal vertigo, left ear: Secondary | ICD-10-CM | POA: Diagnosis not present

## 2021-10-23 DIAGNOSIS — R42 Dizziness and giddiness: Secondary | ICD-10-CM | POA: Diagnosis not present

## 2021-10-23 DIAGNOSIS — R262 Difficulty in walking, not elsewhere classified: Secondary | ICD-10-CM | POA: Diagnosis not present

## 2021-10-26 DIAGNOSIS — H8112 Benign paroxysmal vertigo, left ear: Secondary | ICD-10-CM | POA: Diagnosis not present

## 2021-10-26 DIAGNOSIS — R42 Dizziness and giddiness: Secondary | ICD-10-CM | POA: Diagnosis not present

## 2021-10-26 DIAGNOSIS — R262 Difficulty in walking, not elsewhere classified: Secondary | ICD-10-CM | POA: Diagnosis not present

## 2021-10-31 DIAGNOSIS — R42 Dizziness and giddiness: Secondary | ICD-10-CM | POA: Diagnosis not present

## 2021-10-31 DIAGNOSIS — H8112 Benign paroxysmal vertigo, left ear: Secondary | ICD-10-CM | POA: Diagnosis not present

## 2021-10-31 DIAGNOSIS — R262 Difficulty in walking, not elsewhere classified: Secondary | ICD-10-CM | POA: Diagnosis not present

## 2021-11-07 DIAGNOSIS — R42 Dizziness and giddiness: Secondary | ICD-10-CM | POA: Diagnosis not present

## 2021-11-07 DIAGNOSIS — H8112 Benign paroxysmal vertigo, left ear: Secondary | ICD-10-CM | POA: Diagnosis not present

## 2021-11-07 DIAGNOSIS — R262 Difficulty in walking, not elsewhere classified: Secondary | ICD-10-CM | POA: Diagnosis not present

## 2021-11-14 DIAGNOSIS — H8112 Benign paroxysmal vertigo, left ear: Secondary | ICD-10-CM | POA: Diagnosis not present

## 2021-11-14 DIAGNOSIS — R42 Dizziness and giddiness: Secondary | ICD-10-CM | POA: Diagnosis not present

## 2021-11-14 DIAGNOSIS — R262 Difficulty in walking, not elsewhere classified: Secondary | ICD-10-CM | POA: Diagnosis not present

## 2021-11-17 DIAGNOSIS — J0100 Acute maxillary sinusitis, unspecified: Secondary | ICD-10-CM | POA: Diagnosis not present

## 2021-11-21 DIAGNOSIS — R42 Dizziness and giddiness: Secondary | ICD-10-CM | POA: Diagnosis not present

## 2021-11-21 DIAGNOSIS — R262 Difficulty in walking, not elsewhere classified: Secondary | ICD-10-CM | POA: Diagnosis not present

## 2021-11-21 DIAGNOSIS — H8112 Benign paroxysmal vertigo, left ear: Secondary | ICD-10-CM | POA: Diagnosis not present

## 2022-02-11 DIAGNOSIS — R058 Other specified cough: Secondary | ICD-10-CM

## 2022-02-11 DIAGNOSIS — K8020 Calculus of gallbladder without cholecystitis without obstruction: Secondary | ICD-10-CM

## 2022-02-11 HISTORY — DX: Other specified cough: R05.8

## 2022-02-11 HISTORY — DX: Calculus of gallbladder without cholecystitis without obstruction: K80.20

## 2022-04-19 DIAGNOSIS — R42 Dizziness and giddiness: Secondary | ICD-10-CM | POA: Insufficient documentation

## 2022-04-19 HISTORY — DX: Dizziness and giddiness: R42

## 2022-04-30 DIAGNOSIS — R9082 White matter disease, unspecified: Secondary | ICD-10-CM

## 2022-04-30 HISTORY — DX: White matter disease, unspecified: R90.82

## 2022-08-06 ENCOUNTER — Encounter: Payer: Self-pay | Admitting: Genetic Counselor

## 2022-08-06 ENCOUNTER — Telehealth: Payer: Self-pay | Admitting: Genetic Counselor

## 2022-08-06 NOTE — Telephone Encounter (Signed)
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  Preferred in person [place] visit.  Scheduled for 11/3 at 10am at [place]. Knows she can be seen sooner at [facility] or virtually if she prefers.  Will notify patient if there is a cancellation in the September clinic in [place].  Also reviewed cost of genetic testing and billing process.

## 2022-08-13 ENCOUNTER — Telehealth: Payer: Self-pay | Admitting: Genetic Counselor

## 2022-08-13 NOTE — Telephone Encounter (Signed)
Patient called about OOP cost for genetic testing.  Offered to submit benefits investigation through [employer].  Will be in contact in next week to discuss outcome of BI.

## 2022-08-16 NOTE — Telephone Encounter (Signed)
Called patient w/ outcome of BI through [employer].  Estimated $0 OOP cost.

## 2022-08-29 ENCOUNTER — Telehealth: Payer: Self-pay | Admitting: Genetic Counselor

## 2022-08-29 NOTE — Telephone Encounter (Signed)
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

## 2022-08-30 ENCOUNTER — Inpatient Hospital Stay: Payer: PPO

## 2022-08-30 ENCOUNTER — Inpatient Hospital Stay: Payer: PPO | Attending: Oncology | Admitting: Genetic Counselor

## 2022-08-30 DIAGNOSIS — Z803 Family history of malignant neoplasm of breast: Secondary | ICD-10-CM

## 2022-08-30 DIAGNOSIS — Z8481 Family history of carrier of genetic disease: Secondary | ICD-10-CM

## 2022-08-30 DIAGNOSIS — Z800 Family history of malignant neoplasm of digestive organs: Secondary | ICD-10-CM

## 2022-08-30 NOTE — Progress Notes (Signed)
REFERRING PROVIDER: Self-referred   PRIMARY PROVIDER:  Mayer Camel, NP  PRIMARY REASON FOR VISIT:  Encounter Diagnoses  Name Primary?   Family history of gene mutation Yes   Family history of breast cancer    Family history of colon cancer    Family history of pancreatic cancer     HISTORY OF PRESENT ILLNESS:   Wendy Diaz, a 71 y.o. female, was seen for a [place] cancer genetics consultation due to a family history of cancer and a family history of a known CHEK2 gene mutation in her daughter.  Wendy Diaz presents to clinic today to discuss the possibility of a hereditary predisposition to cancer, to discuss genetic testing, and to further clarify her future cancer risks, as well as potential cancer risks for family members.   Wendy Diaz is a 71 y.o. female with no personal history of cancer.    CANCER HISTORY:  Oncology History   No history exists.     RISK FACTORS:  Mammogram within the last year: yes  Colonoscopy: yes;  most recent in 2020;  . Hysterectomy: yes; postmenopausal  Ovaries intact: no.  Menarche was at age 43.  First live birth at age 69.  Menopause at 43.  OCP use for approximately 12 years.  HRT use: current use; estradiol; ~10 years    FAMILY HISTORY:  We obtained a detailed, 4-generation family history.  Significant diagnoses are listed below: Family History  Problem Relation Age of Onset   Skin cancer Father    Colon polyps Father        less than 10   Colon cancer Brother 69   Throat cancer Brother        dx after 60   Other Brother        neuroendocrine tumor; mets; d. 83   Breast cancer Maternal Aunt        dx 60s   Pancreatic cancer Maternal Grandmother        dx 70s-80s   Liver cancer Paternal Grandmother    Breast cancer Daughter 66       CHEK2 mutation     Wendy Diaz daughter has a pathogenic variant in CHEK2 at  c.1100delC ([id number]*15).  A variant of uncertain significance was detected in CTNNA1 at  p.N169S  (c.506A>G).  No other uncertain of pathogenic variants detected in [place] +RNAinsight Panel.  Report date is 04/22/2022.  The CancerNext-Expanded gene panel offered by [facility] and includes sequencing, rearrangement, and RNA analysis for the following 77 genes: AIP, ALK, APC, ATM, AXIN2, BAP1, BARD1, BLM, BMPR1A, BRCA1, BRCA2, BRIP1, CDC73, CDH1, CDK4, CDKN1B, CDKN2A, CHEK2, CTNNA1, DICER1, FANCC, FH, FLCN, GALNT12, KIF1B, LZTR1, MAX, MEN1, MET, MLH1, MSH2, MSH3, MSH6, MUTYH, NBN, NF1, NF2, NTHL1, PALB2, PHOX2B, PMS2, POT1, PRKAR1A, PTCH1, PTEN, RAD51C, RAD51D, RB1, RECQL, RET, SDHA, SDHAF2, SDHB, SDHC, SDHD, SMAD4, SMARCA4, SMARCB1, SMARCE1, STK11, SUFU, TMEM127, TP53, TSC1, TSC2, VHL and XRCC2 (sequencing and deletion/duplication); EGFR, EGLN1, HOXB13, KIT, MITF, PDGFRA, POLD1, and POLE (sequencing only); EPCAM and GREM1 (deletion/duplication only).    Wendy Diaz is unaware of other previous family history of genetic testing for hereditary cancer risks. There is no reported Ashkenazi Jewish ancestry. There is no known consanguinity.  GENETIC COUNSELING ASSESSMENT: Wendy Diaz is a 71 y.o. female with a family history of a known CHEK2 mutation and a family history that is somewhat suggestive of an additional hereditary cancer syndrome.  We, therefore, discussed and recommended the following at today's visit.   DISCUSSION:  We discussed that 5 - 10% of cancer is hereditary.  Given her daughter has a mutation in the CHEK2 gene, she has a 50% chance of having the same family mutation in [employer].  We reviewed the colon, breast, and other cancer risks and management strategies associated with CHEK2 mutations. There are other genes that can be associated with hereditary colon, breast, or pancreatic cancer syndromes.  We discussed that testing is beneficial for several reasons, including knowing about other cancer risks, identifying potential screening and risk-reduction options that may be  appropriate, and to understanding if other family members could be at risk for cancer and allowing them to undergo genetic testing.  We reviewed the characteristics, features and inheritance patterns of hereditary cancer syndromes. We also discussed genetic testing, including the appropriate family members to test, the process of testing, insurance coverage and turn-around-time for results. We discussed the implications of a negative, positive, carrier and/or variant of uncertain significant result.  We recommended Wendy Diaz pursue genetic testing for a panel that includes CHEK2 and other genes associated with breast, colon, pancreatic, and neuroendocrine cancers.   The Multi-Cancer + RNA Panel offered by [employer] includes sequencing and/or deletion/duplication analysis of the following 84 genes:  AIP*, ALK, APC*, ATM*, AXIN2*, BAP1*, BARD1*, BLM*, BMPR1A*, BRCA1*, BRCA2*, BRIP1*, CASR, CDC73*, CDH1*, CDK4, CDKN1B*, CDKN1C*, CDKN2A, CEBPA, CHEK2*, CTNNA1*, DICER1*, DIS3L2*, EGFR, EPCAM, FH*, FLCN*, GATA2*, GPC3, GREM1, HOXB13, HRAS, KIT, MAX*, MEN1*, MET, MITF, MLH1*, MSH2*, MSH3*, MSH6*, MUTYH*, NBN*, NF1*, NF2*, NTHL1*, PALB2*, PDGFRA, PHOX2B, PMS2*, POLD1*, POLE*, POT1*, PRKAR1A*, PTCH1*, PTEN*, RAD50*, RAD51C*, RAD51D*, RB1*, RECQL4, RET, RUNX1*, SDHA*, SDHAF2*, SDHB*, SDHC*, SDHD*, SMAD4*, SMARCA4*, SMARCB1*, SMARCE1*, STK11*, SUFU*, TERC, TERT, TMEM127*, Tp53*, TSC1*, TSC2*, VHL*, WRN*, and WT1.  RNA analysis is performed for * genes.  Based on Wendy Diaz's family history of a known CHEK2 mutation and a family history of cancer, she meets medical criteria for genetic testing.   We discussed that some people do not want to undergo genetic testing due to fear of genetic discrimination.  A federal law called the Genetic Information Non-Discrimination Act (GINA) of 2008 helps protect individuals against genetic discrimination based on their genetic test results.  It impacts both health insurance and  employment.  With health insurance, it protects against increased premiums, being kicked off insurance or being forced to take a test in order to be insured.  For employment it protects against hiring, firing and promoting decisions based on genetic test results.  GINA does not apply to those in the [employer], those who work for companies with less than 15 employees, and new life insurance or long-term disability insurance policies.  Health status due to a cancer diagnosis is not protected under GINA.  PLAN: After considering the risks, benefits, and limitations, Wendy Diaz provided informed consent to pursue genetic testing and the blood sample was sent to [facility] for analysis of the Multi-Cancer +RNA Panel. Results should be available within approximately 3 weeks' time, at which point they will be disclosed by telephone to Wendy Diaz, as will any additional recommendations warranted by these results. Wendy Diaz will receive a summary of her genetic counseling visit and a copy of her results once available. This information will also be available in Epic.   Wendy Diaz questions were answered to her satisfaction today. Our contact information was provided should additional questions or concerns arise.   Kashawn Manzano M. Joette Catching, [place], [facility] Genetic Counselor [email or web address] (P) [phone]   The patient was seen for a total of  30 minutes in face-to-face genetic counseling.  The patient was seen alone.  Dr. Hinton Rao was available to discuss this case as needed.  _______________________________________________________________________ For Office Staff:  Number of people involved in session: 1 Was an Intern/ student involved with case: yes; [facility] intern [employer]

## 2022-09-02 ENCOUNTER — Ambulatory Visit: Payer: PRIVATE HEALTH INSURANCE | Admitting: Cardiology

## 2022-09-10 ENCOUNTER — Encounter: Payer: Self-pay | Admitting: Cardiology

## 2022-09-10 DIAGNOSIS — E0590 Thyrotoxicosis, unspecified without thyrotoxic crisis or storm: Secondary | ICD-10-CM

## 2022-09-10 HISTORY — DX: Thyrotoxicosis, unspecified without thyrotoxic crisis or storm: E05.90

## 2022-09-12 ENCOUNTER — Ambulatory Visit: Payer: PPO | Attending: Cardiology | Admitting: Cardiology

## 2022-09-12 ENCOUNTER — Ambulatory Visit: Payer: PPO | Attending: Cardiology

## 2022-09-12 ENCOUNTER — Encounter: Payer: Self-pay | Admitting: Cardiology

## 2022-09-12 VITALS — BP 160/80 | HR 62 | Ht 62.0 in | Wt 153.0 lb

## 2022-09-12 DIAGNOSIS — I700 Atherosclerosis of aorta: Secondary | ICD-10-CM | POA: Diagnosis not present

## 2022-09-12 DIAGNOSIS — R0609 Other forms of dyspnea: Secondary | ICD-10-CM

## 2022-09-12 DIAGNOSIS — R42 Dizziness and giddiness: Secondary | ICD-10-CM

## 2022-09-12 DIAGNOSIS — R0683 Snoring: Secondary | ICD-10-CM

## 2022-09-12 DIAGNOSIS — R001 Bradycardia, unspecified: Secondary | ICD-10-CM | POA: Diagnosis not present

## 2022-09-12 DIAGNOSIS — E782 Mixed hyperlipidemia: Secondary | ICD-10-CM

## 2022-09-12 DIAGNOSIS — I10 Essential (primary) hypertension: Secondary | ICD-10-CM

## 2022-09-12 DIAGNOSIS — R400 Somnolence: Secondary | ICD-10-CM

## 2022-09-12 NOTE — Patient Instructions (Signed)
Medication Instructions:  Your physician recommends that you continue on your current medications as directed. Please refer to the Current Medication list given to you today.  *If you need a refill on your cardiac medications before your next appointment, please call your pharmacy*   Lab Work: None ordered If you have labs (blood work) drawn today and your tests are completely normal, you will receive your results only by: [place] (if you have MyChart) OR A paper copy in the mail If you have any lab test that is abnormal or we need to change your treatment, we will call you to review the results.   Testing/Procedures: Your physician has requested that you have an echocardiogram. Echocardiography is a painless test that uses sound waves to create images of your heart. It provides your doctor with information about the size and shape of your heart and how well your heart's chambers and valves are working. This procedure takes approximately one hour. There are no restrictions for this procedure.   WHY IS MY DOCTOR PRESCRIBING ZIO? The Zio system is proven and trusted by physicians to detect and diagnose irregular heart rhythms -- and has been prescribed to hundreds of thousands of patients.  The FDA has cleared the Zio system to monitor for many different kinds of irregular heart rhythms. In a study, physicians were able to reach a diagnosis 90% of the time with the Zio system1.  You can wear the Zio monitor -- a small, discreet, comfortable patch -- during your normal day-to-day activity, including while you sleep, shower, and exercise, while it records every single heartbeat for analysis.  1Barrett, P., et al. Comparison of 24 Hour Holter Monitoring Versus 14 Day Novel Adhesive Patch Electrocardiographic Monitoring. [place], 2014.  ZIO VS. HOLTER MONITORING The Zio monitor can be comfortably worn for up to 14 days. Holter monitors can be worn for 24 to 48  hours, limiting the time to record any irregular heart rhythms you may have. Zio is able to capture data for the 51% of patients who have their first symptom-triggered arrhythmia after 48 hours.1  LIVE WITHOUT RESTRICTIONS The Zio ambulatory cardiac monitor is a small, unobtrusive, and water-resistant patch--you might even forget you're wearing it. The Zio monitor records and stores every beat of your heart, whether you're sleeping, working out, or showering. Wear the monitor for 14 days, remove 09/26/22.  WatchPAT? is a revolutionary FDA-Cleared portable home sleep apnea test and diagnostic device that uses innovative technologies to ensure the accurate detection of sleep apnea. Unlike more complicated diagnostic devices, the WatchPAT? home sleep study device is easy to use, accurate, and reliable Home Sleep Apnea Test (HSAT) and ambulatory sleep study device.  WatchPAT? was designed with patient use in mind for "in-home" sleep apnea testing in the comfort of your own bedroom. This environment is more representative of your personal sleep habits. The WatchPAT home sleep device's simple operation and robust, noninvasive design, ensure the best possible sleep experience.  WatchPAT home sleep test device has 3 points of contact design makes it easy to use. Simply prepare for sleep as you normally do. Just before you go to bed;  Attach the chest sensor Strap on the WatchPAT home sleep apnea test bracelet to your non-dominate hand Slip on the finger probe  Follow-Up: At [facility], you and your health needs are our priority.  As part of our continuing mission to provide you with exceptional heart care, we have created designated Provider Care Teams.  These Care Teams include your primary Cardiologist (physician) and Advanced Practice Providers (APPs -  Physician Assistants and Nurse Practitioners) who all work together to provide you with the care you need, when you need it.  We recommend  signing up for the patient portal called "MyChart".  Sign up information is provided on this After Visit Summary.  MyChart is used to connect with patients for Virtual Visits (Telemedicine).  Patients are able to view lab/test results, encounter notes, upcoming appointments, etc.  Non-urgent messages can be sent to your provider as well.   To learn more about what you can do with MyChart, go to [email or web address].    Your next appointment:   6 week(s)  The format for your next appointment:   In Person  Provider:   Jenne Campus, MD   Other Instructions Echocardiogram An echocardiogram is a test that uses sound waves (ultrasound) to produce images of the heart. Images from an echocardiogram can provide important information about: Heart size and shape. The size and thickness and movement of your heart's walls. Heart muscle function and strength. Heart valve function or if you have stenosis. Stenosis is when the heart valves are too narrow. If blood is flowing backward through the heart valves (regurgitation). A tumor or infectious growth around the heart valves. Areas of heart muscle that are not working well because of poor blood flow or injury from a heart attack. Aneurysm detection. An aneurysm is a weak or damaged part of an artery wall. The wall bulges out from the normal force of blood pumping through the body. Tell a health care provider about: Any allergies you have. All medicines you are taking, including vitamins, herbs, eye drops, creams, and over-the-counter medicines. Any blood disorders you have. Any surgeries you have had. Any medical conditions you have. Whether you are pregnant or may be pregnant. What are the risks? Generally, this is a safe test. However, problems may occur, including an allergic reaction to dye (contrast) that may be used during the test. What happens before the test? No specific preparation is needed. You may eat and drink  normally. What happens during the test? You will take off your clothes from the waist up and put on a hospital gown. Electrodes or electrocardiogram (ECG)patches may be placed on your chest. The electrodes or patches are then connected to a device that monitors your heart rate and rhythm. You will lie down on a table for an ultrasound exam. A gel will be applied to your chest to help sound waves pass through your skin. A handheld device, called a transducer, will be pressed against your chest and moved over your heart. The transducer produces sound waves that travel to your heart and bounce back (or "echo" back) to the transducer. These sound waves will be captured in real-time and changed into images of your heart that can be viewed on a video monitor. The images will be recorded on a computer and reviewed by your health care provider. You may be asked to change positions or hold your breath for a short time. This makes it easier to get different views or better views of your heart. In some cases, you may receive contrast through an IV in one of your veins. This can improve the quality of the pictures from your heart. The procedure may vary among health care providers and hospitals.   What can I expect after the test? You may return to your normal, everyday life, including diet, activities, and medicines, unless  your health care provider tells you not to do that. Follow these instructions at home: It is up to you to get the results of your test. Ask your health care provider, or the department that is doing the test, when your results will be ready. Keep all follow-up visits. This is important. Summary An echocardiogram is a test that uses sound waves (ultrasound) to produce images of the heart. Images from an echocardiogram can provide important information about the size and shape of your heart, heart muscle function, heart valve function, and other possible heart problems. You do not need to do  anything to prepare before this test. You may eat and drink normally. After the echocardiogram is completed, you may return to your normal, everyday life, unless your health care provider tells you not to do that. This information is not intended to replace advice given to you by your health care provider. Make sure you discuss any questions you have with your health care provider. Document Revised: 08/01/2020 Document Reviewed: 08/01/2020 Elsevier Patient Education  2021 [employer].

## 2022-09-12 NOTE — Addendum Note (Signed)
Addended by: Truddie Hidden on: 09/12/2022 10:56 AM   Modules accepted: Orders

## 2022-09-12 NOTE — Progress Notes (Signed)
Cardiology Consultation:    Date:  09/12/2022   ID:  Wendy Diaz, DOB 1951-04-09, MRN [id number]  PCP:  Wendy Camel, NP  Cardiologist:  Jenne Campus, MD   Referring MD: Wendy Diaz*   Chief Complaint  Patient presents with   Hypertension    History of Present Illness:    Wendy Diaz is a 71 y.o. female who is being seen today for the evaluation of essential hypertension at the request of Wendy Diaz, Wendy Diaz*.  Past medical history significant for essential hypertension recently she started therapy for it about a year ago but she does have difficulty with medications, on top of that she does have difficulty hearing she is wearing hearing aid, she also got dizziness as well as suffer from tinnitus sometimes.  She was referred to [place] because of difficulty controlling her blood pressure.  She tried numerous medications including valsartan chlorthalidone amlodipine losartan all those medications gave her some dizziness and simply felt weak tired and exhausted while taking this medication therefore she is discouraged and she stopped all this medication doing well without it.  Denies have any cardiac complaints otherwise.  There is no chest pain tightness squeezing pressure burning chest no palpitation.  Interestingly she checked her blood pressure on the regular basis blood pressures usually fluctuate between 1 35-1 50 however her heart rate is always kind of low in the neighborhood of 50-60.  She denies have any passing out the dizziness to where she described it is when she turns her head very quickly will happen she did have some physical therapy for it with good results she also have some exercise she is trying to do herself which helps a lot however she does not like to do those exercises because typically make her nauseated and sick on the stomach to the point that sometimes she needs to vomit.  She is trying to be active but goes to gym and rarely and she is  upset about herself regarding that issue.  Her husband goes to gym on the regular basis she is not.  Maybe once a week no more than that.  She have no difficulty walking climbing stairs or exercising she is not on any special diet does not have family history of premature coronary artery disease  Past Medical History:  Diagnosis Date   Aortic atherosclerosis ([place]) 04/05/2016   Formatting of this note might be different from the original. Seen on CT of Chest in 03/2016.   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 CT scan March 2023 showed stability 3.7 cm   Episodic recurrent vertigo 10/21/2018   Essential hypertension 02/09/2016   Formatting of this note might be different from the original. signficant difficulty with BP meds and side effects  Last Assessment & Plan:  Formatting of this note might be different from the original. Relevant Hx: Course: Daily Update: Today's Plan:she has checked outside of here and she has better readings will follow  Electronically signed by: Wendy Camel, NP 02/09/16 2136   Ganglion cyst of right foot 04/08/2017   GERD without esophagitis 02/09/2016   Last Assessment & Plan:  Formatting of this note might be different from the original. Relevant Hx: Course: Daily Update: Today's Plan:this has been stable for her and will follow  Electronically signed by:  Wendy Camel, NP 02/09/16 2136   Hyperthyroidism 09/10/2022   Hypothyroidism (acquired) 02/09/2016   Last Assessment & Plan:  Formatting of this note might be different from the original. Relevant Hx: Course: Daily Update: Today's Plan:update her level on her current dose of med  Electronically signed by: Wendy Camel, NP 02/09/16 2137   Irritable bowel syndrome with constipation 02/09/2016    Last Assessment & Plan:  Formatting of this note might be different from the original. 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 Relevant Hx: Course: Daily Update: Today's Plan:there are some issues at home but she is working through those as well as at work and with her mother who is in a nursing home continue with her med  Electronically signed by: Wendy Camel, NP 02/09/16 2139   Recurrent dry cough 02/11/2022   Trochanteric bursitis of left hip 07/06/2020   White matter abnormality on MRI of brain 04/30/2022    Past Surgical History:  Procedure Laterality Date   ABDOMINAL HYSTERECTOMY     BUNIONECTOMY Bilateral    GANGLION CYST EXCISION     Left wrist and Right great toe X2   LAMINECTOMY      Current Medications: Current Meds  Medication Sig   calcium carbonate (CALCIUM 600) 600 MG TABS tablet Take 600 mg by mouth daily.   Cholecalciferol 50 MCG (2000 UT) CAPS Take 2,000 Units by mouth daily.   estradiol (ESTRACE) 0.5 MG tablet Take 0.5 mg by mouth daily.   levothyroxine (SYNTHROID) 50 MCG tablet Take 50 mcg by mouth daily.   lovastatin (MEVACOR) 10 MG tablet Take 1 tablet  by mouth daily.   sertraline (ZOLOFT) 100 MG tablet Take 100 mg by mouth daily.     Allergies:   Patient has no known allergies.   Social History   Socioeconomic History   Marital status: Single    Spouse name: Not on file   Number of children: Not on file   Years of education: Not on file   Highest education level: Not on file  Occupational History   Not on file  Tobacco Use   Smoking status: Never   Smokeless tobacco: Never  Substance and Sexual Activity   Alcohol use: Never   Drug use: Not on file   Sexual activity: Not on file  Other Topics Concern   Not on file  Social History Narrative   Not on file   Social Determinants of Health   Financial Resource Strain: Not on file  Food Insecurity: Not on file  Transportation Needs: Not on file  Physical Activity: Not on file  Stress: Not on file  Social Connections: Not on file  Family History: The patient's family history includes Breast cancer in her daughter; Cancer in her brother, brother, and brother; Heart attack in her father; Stroke in her mother. ROS:   Please see the history of present illness.    All 14 point review of systems negative except as described per history of present illness.  EKGs/Labs/Other Studies Reviewed:    The following studies were reviewed today: She did have a stress test done in 2019 by Dr. Beatrix Fetters.  Stress test was negative  EKG:  EKG is  ordered today.  The ekg ordered today demonstrates normal sinus rhythm possible left atrial enlargement, left ventricle hypertrophy by voltage criteria, nonspecific ST segment changes  Recent Labs: No results found for requested labs within last 365 days.  Recent Lipid Panel No results found for: "CHOL", "TRIG", "HDL", "CHOLHDL", "VLDL", "LDLCALC", "LDLDIRECT"  Physical Exam:    VS:  BP (!) 160/80 (BP Location: Left Arm, Patient Position: Sitting, Cuff Size: Normal)   Pulse 62   Ht '5\' 2"'$  (1.575 m)   Wt 153 lb (69.4 kg)   SpO2 94%   BMI  27.98 kg/m     Wt Readings from Last 3 Encounters:  09/12/22 153 lb (69.4 kg)  07/17/22 146 lb (66.2 kg)     GEN:  Well nourished, well developed in no acute distress HEENT: Normal NECK: No JVD; No carotid bruits LYMPHATICS: No lymphadenopathy CARDIAC: RRR, no murmurs, no rubs, no gallops RESPIRATORY:  Clear to auscultation without rales, wheezing or rhonchi  ABDOMEN: Soft, non-tender, non-distended MUSCULOSKELETAL:  No edema; No deformity  SKIN: Warm and dry NEUROLOGIC:  Alert and oriented x 3 PSYCHIATRIC:  Normal affect   ASSESSMENT:    1. Essential hypertension   2. Aortic atherosclerosis ([place])   3. Mixed hyperlipidemia   4. Dizziness    PLAN:    In order of problems listed above:  Very complex and difficult situation clearly she does have high blood pressure however she is intolerant to multiple medications we did discuss about nonpharmacological ways to manage blood pressure which include avoidance of salt, good sleeping habits, exercises on the regular basis and weight loss.  She tells me that she snores.  And I think it would be valuable to check if she does have significant sleep apnea.  I will not add a medication right now since she try almost 5 medication all gave her a problem I asked her to check blood pressure on the regular basis taken out of it and bring it to me next time when she will be here in the meantime I will schedule her to have echocardiogram done to assess left ventricle ejection fraction most likely looking for left ventricle hypertrophy, we will also look at pulmonary pressure.  She will be scheduled have a sleep study make sure she does not have any significant obstructive sleep apnea that can be managed and help with the blood pressure without medications.  I am strongly suspecting she may have Mnire's disease.  Dizziness with tinnitus and with hearing problem I would suggest that.  Ask her to look for ENT doctor and have this reevaluated. Mixed  dyslipidemia she is taking low-dose lovastatin which is a week statin.  Her cholesterol still slightly elevated but I will not going to do anything about it right now this is something we can talk about in the future. Sinus bradycardia not to the level that it create dizziness but I will make sure this is not contributing to her dizziness,  therefore, I will ask her to get Zio patch for 2 weeks to make sure she does have any significant bradycardia   Medication Adjustments/Labs and Tests Ordered: Current medicines are reviewed at length with the patient today.  Concerns regarding medicines are outlined above.  No orders of the defined types were placed in this encounter.  No orders of the defined types were placed in this encounter.   Signed, Park Liter, MD, [facility]. 09/12/2022 10:23 AM    [place]

## 2022-09-13 ENCOUNTER — Telehealth: Payer: Self-pay | Admitting: Cardiology

## 2022-09-13 NOTE — Telephone Encounter (Signed)
Spoke with pt. She stated that she was having some irritation around the monitor adhesive. Encouraged to use Hydrocortisone cream or Benadryl Cream around the area to see if this will help. She also said she was having a dull headache since getting the monitor. Encouraged pt to document her headache with the monitor. Encouraged her to use Tylenol or Ibuprofen if she does not have any contraindications for the headache. She agreed and had no further questions.

## 2022-09-13 NOTE — Telephone Encounter (Signed)
Patient states since heart monitor was applied she has had a lingering dull headache. She also mentions there is redness/irritation around the adhesive on her monitor.

## 2022-09-18 ENCOUNTER — Telehealth: Payer: Self-pay | Admitting: *Deleted

## 2022-09-18 NOTE — Telephone Encounter (Signed)
Staff message sent to Truddie Hidden ok to activate itamar device.

## 2022-09-18 NOTE — Addendum Note (Signed)
Addended by: Truddie Hidden on: 09/18/2022 10:04 AM   Modules accepted: Orders

## 2022-09-25 ENCOUNTER — Telehealth: Payer: Self-pay | Admitting: Genetic Counselor

## 2022-09-25 NOTE — Telephone Encounter (Signed)
Called patient to discuss genetics results.  Patient in meeting and unable to talk.  Told patient that will call later this week.

## 2022-09-26 ENCOUNTER — Ambulatory Visit: Payer: PPO | Attending: Cardiology

## 2022-09-26 DIAGNOSIS — R0609 Other forms of dyspnea: Secondary | ICD-10-CM

## 2022-09-26 LAB — ECHOCARDIOGRAM COMPLETE
Area-P 1/2: 3.6 cm2
MV M vel: 3.97 m/s
MV Peak grad: 63 mmHg
P 1/2 time: 614 msec
S' Lateral: 2.8 cm

## 2022-09-30 ENCOUNTER — Telehealth: Payer: Self-pay | Admitting: Cardiology

## 2022-09-30 NOTE — Telephone Encounter (Signed)
Called patient back about her message. Patient was having trouble activating her sleep monitor, Itamar. Called and spoke with nurse at [facility] office. Nurse has registered patient now, and patient should be okay to do study tonight. Called patient back to let her know she can do study tonight and she should be able to activate her monitor. Encouraged patient to call our office if she has any other questions.

## 2022-09-30 NOTE — Telephone Encounter (Signed)
Pt is having issues with her itamar device and is requesting to speak to someone.

## 2022-10-01 ENCOUNTER — Telehealth: Payer: Self-pay | Admitting: Genetic Counselor

## 2022-10-01 NOTE — Telephone Encounter (Signed)
Patient stated she received message form [employer] with OOP estimate of $100 for genetic testing after [employer] BI before sample was submitted estimated $0 OOP.  Per [employer], "The $100.00 quote is our OON rate. That said, since this is a Medicare plan there is no patient responsibility as previously mentioned." Communicated this to patient and forwarded patient email chain with communication from lab.

## 2022-10-08 ENCOUNTER — Telehealth: Payer: Self-pay | Admitting: Cardiology

## 2022-10-08 ENCOUNTER — Telehealth: Payer: Self-pay

## 2022-10-08 NOTE — Telephone Encounter (Signed)
Patient returned call for her echo results.  

## 2022-10-08 NOTE — Telephone Encounter (Signed)
Results reviewed with pt as per Dr. Krasowski's note.  Pt verbalized understanding and had no additional questions. Routed to PCP  

## 2022-10-08 NOTE — Telephone Encounter (Signed)
LVM to call regarding Echocardiogram results.

## 2022-10-18 ENCOUNTER — Telehealth: Payer: Self-pay | Admitting: Cardiology

## 2022-10-18 NOTE — Telephone Encounter (Signed)
Patient states Dr. Geraldo Pitter is still on all the of her orders for her tests and she wants his name removed. She says Dr. Agustin Cree is her doctor and she wants to see his name on the tests and not Dr. Geraldo Pitter.

## 2022-10-23 ENCOUNTER — Encounter (HOSPITAL_BASED_OUTPATIENT_CLINIC_OR_DEPARTMENT_OTHER): Payer: PPO | Admitting: Cardiology

## 2022-10-23 DIAGNOSIS — G4733 Obstructive sleep apnea (adult) (pediatric): Secondary | ICD-10-CM | POA: Diagnosis not present

## 2022-10-24 ENCOUNTER — Encounter: Payer: Self-pay | Admitting: Cardiology

## 2022-10-24 ENCOUNTER — Ambulatory Visit: Payer: PPO | Attending: Cardiology | Admitting: Cardiology

## 2022-10-24 VITALS — BP 142/80 | HR 56 | Ht 61.75 in | Wt 151.0 lb

## 2022-10-24 DIAGNOSIS — I10 Essential (primary) hypertension: Secondary | ICD-10-CM

## 2022-10-24 DIAGNOSIS — R42 Dizziness and giddiness: Secondary | ICD-10-CM

## 2022-10-24 DIAGNOSIS — I700 Atherosclerosis of aorta: Secondary | ICD-10-CM | POA: Diagnosis not present

## 2022-10-24 NOTE — Telephone Encounter (Signed)
Patient was called and informed that the reason Dr. Julien Nordmann name appeared on her studies was that the was the DOD for that day and read the studies for the Referring Provider.  Pt. Stated she understood and no further action is needed.

## 2022-10-24 NOTE — Patient Instructions (Signed)

## 2022-10-24 NOTE — Progress Notes (Signed)
Cardiology Office Note:    Date:  10/24/2022   ID:  Wendy Diaz, DOB Sep 17, 1951, MRN [id number]  PCP:  Mayer Camel, NP  Cardiologist:  Jenne Campus, MD    Referring MD: Bess Harvest*   Chief Complaint  Patient presents with   Results    History of Present Illness:    Wendy Diaz is a 71 y.o. female with past medical history significant for essential hypertension, dyslipidemia, sleep apnea.  She comes today for follow-up.  She did have few test done and she is here to discuss results of this test.  Overall she is doing fine.  She got difficulty tolerating Zio patch because she developed headache mixed with that, she also got difficulty managing her home sleep study and we still do not have results of it.  Denies have any chest pain tightness squeezing pressure burning chest can walk climb stairs with no difficulty no dizziness.  Another issue that we investigated was bradycardia.  Past Medical History:  Diagnosis Date   Aortic atherosclerosis ([place]) 04/05/2016   Formatting of this note might be different from the original. Seen on CT of Chest in 03/2016.   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 CT scan March 2023 showed stability 3.7 cm   Episodic recurrent vertigo 10/21/2018   Essential hypertension 02/09/2016   Formatting of this note might be different from the original. signficant difficulty with BP meds and side effects  Last Assessment & Plan:  Formatting of this note might be different from the original. Relevant Hx: Course: Daily Update: Today's Plan:she has checked outside of here and she has better readings will follow  Electronically signed by: Mayer Camel, NP 02/09/16 2136   Ganglion cyst of right foot 04/08/2017   GERD without  esophagitis 02/09/2016   Last Assessment & Plan:  Formatting of this note might be different from the original. Relevant Hx: Course: Daily Update: Today's Plan:this has been stable for her and will follow  Electronically signed by: Mayer Camel, NP 02/09/16 2136   Hyperthyroidism 09/10/2022   Hypothyroidism (acquired) 02/09/2016   Last Assessment & Plan:  Formatting of this note might be different from the original. Relevant Hx: Course: Daily Update: Today's Plan:update her level on her current dose of med  Electronically signed by: Mayer Camel, NP 02/09/16 2137   Irritable bowel syndrome with constipation 02/09/2016   Last Assessment & Plan:  Formatting of this note might be different from the original. 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 Relevant Hx: Course: Daily Update: Today's Plan:2016 dexa with -1.7 t score femur, and taking calcium and vitamin D  Electronically signed by: Mayer Camel, NP 02/09/16 1101   Paget's disease of bone 02/17/2018   Formatting of this note might be different from the original. Seen at L[facility]   Reactive depression 02/09/2016   Last Assessment & Plan:  Formatting of this note might be  different from the original. 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   Cholecalciferol 50 MCG (2000 UT) CAPS Take 2,000 Units by mouth daily.   estradiol (ESTRACE) 0.5 MG tablet Take 0.5 mg by mouth daily.   levothyroxine (SYNTHROID) 50 MCG tablet Take 50 mcg by mouth daily.   lovastatin (MEVACOR) 10 MG tablet Take 1 tablet by mouth daily.   sertraline (ZOLOFT) 100 MG tablet Take 100 mg by mouth daily.     Allergies:   Patient has no known allergies.   Social History   Socioeconomic History   Marital status: Single    Spouse name: Not on file   Number of children: Not on file   Years of education: Not on file   Highest education level: Not on file  Occupational History   Not on file  Tobacco Use   Smoking status: Never   Smokeless tobacco: Never  Substance and Sexual Activity   Alcohol use: Never   Drug use: Not on file   Sexual activity: Not on file  Other Topics Concern   Not on file  Social History Narrative   Not on file   Social Determinants of Health   Financial Resource Strain: Not on file  Food Insecurity: Not on file  Transportation Needs: Not on file  Physical Activity: Not on file  Stress: Not on file  Social Connections: Not on file     Family History: The patient's family history includes Breast cancer in her daughter; Cancer in her brother, brother, and brother; Heart attack in her father; Stroke in her mother. ROS:   Please see the history of present illness.    All 14 point review of systems negative except as described per history of present illness  EKGs/Labs/Other Studies Reviewed:      Recent Labs: No results found for  requested labs within last 365 days.  Recent Lipid Panel No results found for: "CHOL", "TRIG", "HDL", "CHOLHDL", "VLDL", "LDLCALC", "LDLDIRECT"  Physical Exam:    VS:  BP (!) 142/80 (BP Location: Left Arm, Patient Position: Sitting)   Pulse (!) 56   Ht 5' 1.75" (1.568 m)   Wt 151 lb (68.5 kg)   SpO2 94%   BMI 27.84 kg/m     Wt Readings from Last 3 Encounters:  10/24/22 151 lb (68.5 kg)  09/12/22 153 lb (69.4 kg)  07/17/22 146 lb (66.2 kg)     GEN:  Well nourished, well developed in no acute distress HEENT: Normal NECK: No JVD; No carotid bruits LYMPHATICS: No lymphadenopathy CARDIAC: RRR, no murmurs, no rubs, no gallops RESPIRATORY:  Clear to auscultation without rales, wheezing or rhonchi  ABDOMEN: Soft, non-tender, non-distended MUSCULOSKELETAL:  No edema; No deformity  SKIN:  Warm and dry LOWER EXTREMITIES: no swelling NEUROLOGIC:  Alert and oriented x 3 PSYCHIATRIC:  Normal affect   ASSESSMENT:    1. Essential hypertension   2. Aortic atherosclerosis ([place])   3. Dizziness    PLAN:    In order of problems listed above:  Sinus bradycardia results of Zio patch reviewed average heart rate 60, slowest heart rate 45.  No significant arrhythmia except for some supraventricular tachycardia which was benign and asymptomatic, no need to treat with medications. Essential hypertension still problematic, echocardiogram did not show significant left ventricle hypertrophy.  She tells me that blood pressure usually is good however she showed me the numbers from home on her blood pressure monitor which usually between 1 79-0 45 systolic a little bit on the higher side.  She tried multiple medication have difficulty tolerating this we again reviewed nonpharmacological ways to manage blood pressure including salt reduction, exercises weight loss I still suspect she does have a sleep apnea a few manages appropriately that will improve her situation. Obstructive sleep apnea which I strongly  suspect.  She did wear a home study monitor however we have no results.  I am worried if there was some technical difficulty we will try to investigate and put it on again if needed. We talk about healthy lifestyle we will see her back in about 3 months   Medication Adjustments/Labs and Tests Ordered: Current medicines are reviewed at length with the patient today.  Concerns regarding medicines are outlined above.  No orders of the defined types were placed in this encounter.  Medication changes: No orders of the defined types were placed in this encounter.   Signed, Park Liter, MD, [facility] 10/24/2022 1:25 PM    [facility]

## 2022-10-24 NOTE — Procedures (Signed)
SLEEP STUDY REPORT Patient Information Study Date: 10/01/22 Patient Name: Wendy Diaz Patient ID: [id number] Birth Date: 07-Aug-2051 Age: 71 Gender: Female BMI: 28.0 (W=152 lb, H=5' 2'') Referring Physician: Jenne Campus, MD  TEST DESCRIPTION:  Home sleep apnea testing was completed using the WatchPat, a Type 1 device, utilizing peripheral arterial tonometry (PAT), chest movement, actigraphy, pulse oximetry, pulse rate, body position and snore.  AHI was calculated with apnea and hypopnea using valid sleep time as the denominator. RDI includes apneas, hypopneas, and RERAs.  The data acquired and the scoring of sleep and all associated events were performed in accordance with the recommended standards and specifications as outlined in the AASM Manual for the Scoring of Sleep and Associated Events 2.2.0 (2015).  FINDINGS:  1.  Moderate Obstructive Sleep Apnea with AHI 16.4/hr.   2.  No Central Sleep Apnea with pAHIc 0.6/hr.  3.  Oxygen desaturations as low as 77%.  4.  Moderate snoring was present. O2 sats were < 88% for 5.6 min.  5.  Total sleep time was 6 hrs and 17 min.  6.  15.9% of total sleep time was spent in REM sleep.   7.  Normal sleep onset latency at 17 min  8.  Shortened REM sleep onset latency at 66 min.   9.  Total awakenings were 6.  10. Arrhythmia detection:  None.  DIAGNOSIS:   Moderate Obstructive Sleep Apnea (G47.33) Nocturnal Hypoxemia  RECOMMENDATIONS:   1.  Clinical correlation of these findings is necessary.  The decision to treat obstructive sleep apnea (OSA) is usually based on the presence of apnea symptoms or the presence of associated medical conditions such as Hypertension, Congestive Heart Failure, Atrial Fibrillation or Obesity.  The most common symptoms of OSA are snoring, gasping for breath while sleeping, daytime sleepiness and fatigue.   2.  Initiating apnea therapy is recommended given the presence of symptoms and/or associated conditions.  Recommend proceeding with one of the following:     a.  Auto-CPAP therapy with a pressure range of 5-20cm H2O.     b.  An oral appliance (OA) that can be obtained from certain dentists with expertise in sleep medicine.  These are primarily of use in non-obese patients with mild and moderate disease.     c.  An ENT consultation which may be useful to look for specific causes of obstruction and possible treatment options.     d.  If patient is intolerant to PAP therapy, consider referral to ENT for evaluation for hypoglossal nerve stimulator.   3.  Close follow-up is necessary to ensure success with CPAP or oral appliance therapy for maximum benefit.  4.  A follow-up oximetry study on CPAP is recommended to assess the adequacy of therapy and determine the need for supplemental oxygen or the potential need for Bi-level therapy.  An arterial blood gas to determine the adequacy of baseline ventilation and oxygenation should also be considered.  5.  Healthy sleep recommendations include:  adequate nightly sleep (normal 7-9 hrs/night), avoidance of caffeine after noon and alcohol near bedtime, and maintaining a sleep environment that is cool, dark and quiet.  6.  Weight loss for overweight patients is recommended.  Even modest amounts of weight loss can significantly improve the severity of sleep apnea.  7.  Snoring recommendations include:  weight loss where appropriate, side sleeping, and avoidance of alcohol before bed.  8.  Operation of motor vehicle should not be performed when sleepy.  Signature: Fransico Him, MD;  [facility]; Jay, Tax adviser of Sleep Medicine Electronically Signed: 10/25/22

## 2022-10-25 ENCOUNTER — Other Ambulatory Visit: Payer: PRIVATE HEALTH INSURANCE

## 2022-10-25 ENCOUNTER — Encounter: Payer: PRIVATE HEALTH INSURANCE | Admitting: Genetic Counselor

## 2022-10-28 ENCOUNTER — Ambulatory Visit: Payer: PPO | Attending: Cardiology

## 2022-10-28 DIAGNOSIS — R0683 Snoring: Secondary | ICD-10-CM

## 2022-10-28 DIAGNOSIS — R400 Somnolence: Secondary | ICD-10-CM

## 2022-10-30 ENCOUNTER — Telehealth: Payer: Self-pay | Admitting: *Deleted

## 2022-10-30 ENCOUNTER — Other Ambulatory Visit: Payer: Self-pay | Admitting: Cardiology

## 2022-10-30 DIAGNOSIS — G4736 Sleep related hypoventilation in conditions classified elsewhere: Secondary | ICD-10-CM

## 2022-10-30 NOTE — Telephone Encounter (Signed)
-----   Message from Sueanne Margarita, MD sent at 10/24/2022  9:37 PM EDT ----- Please let patient know that they have sleep apnea and recommend treating with CPAP.  Please order an auto CPAP from 4-15cm H2O with heated humidity and mask of choice.  Order overnight pulse ox on CPAP.  Followup with me in 6 weeks.  Please let patient know that they have sleep apnea and recommend treating with CPAP.  Please order an auto CPAP from 4-15cm H2O with heated humidity and mask of choice.  Order overnight pulse ox on CPAP.  Followup with me in 6 weeks.

## 2022-10-30 NOTE — Telephone Encounter (Signed)
Left message to return a call to discuss sleep study results and recommendations. 

## 2022-10-30 NOTE — Telephone Encounter (Signed)
Patient returned a call and was given sleep study results and recommendations. She agrees to proceed with using CPAP device. Order has been placed by Dr Radford Pax and sent to [facility].

## 2022-11-10 ENCOUNTER — Ambulatory Visit: Payer: Self-pay | Admitting: Genetic Counselor

## 2022-11-10 ENCOUNTER — Encounter: Payer: Self-pay | Admitting: Genetic Counselor

## 2022-11-10 DIAGNOSIS — Z800 Family history of malignant neoplasm of digestive organs: Secondary | ICD-10-CM

## 2022-11-10 DIAGNOSIS — Z8481 Family history of carrier of genetic disease: Secondary | ICD-10-CM

## 2022-11-10 DIAGNOSIS — Z1379 Encounter for other screening for genetic and chromosomal anomalies: Secondary | ICD-10-CM

## 2022-11-10 DIAGNOSIS — Z803 Family history of malignant neoplasm of breast: Secondary | ICD-10-CM

## 2022-11-10 HISTORY — DX: Encounter for other screening for genetic and chromosomal anomalies: Z13.79

## 2022-11-10 NOTE — Progress Notes (Signed)
HPI:   Ms. Rybacki was previously seen in the [place] clinic due to a family history of cancer and a family history of a known CHEK2 mutation in her daughter.  Please refer to our prior cancer genetics clinic note for more information regarding our discussion, assessment and recommendations, at the time. Ms. Kington recent genetic test results were disclosed to her, as were recommendations warranted by these results. These results and recommendations are discussed in more detail below.  CANCER HISTORY:  Oncology History   No history exists.    FAMILY HISTORY:  We obtained a detailed, 4-generation family history.  Significant diagnoses are listed below:      Family History  Problem Relation Age of Onset   Skin cancer Father     Colon polyps Father          less than 10   Colon cancer Brother 39   Throat cancer Brother          dx after 32   Other Brother          neuroendocrine tumor; mets; d. 64   Breast cancer Maternal Aunt          dx 60s   Pancreatic cancer Maternal Grandmother          dx 70s-80s   Liver cancer Paternal Grandmother     Breast cancer Daughter 39        CHEK2 mutation       Ms. Peddy's daughter has a pathogenic variant in CHEK2 at  c.1100delC ([id number]).  A variant of uncertain significance was detected in CTNNA1 at  p.N169S (c.506A>G).  No other uncertain of pathogenic variants detected in [facility] +RNAinsight Panel.  Report date is 04/22/2022.  The CancerNext-Expanded gene panel offered by [facility] and includes sequencing, rearrangement, and RNA analysis for the following 77 genes: AIP, ALK, APC, ATM, AXIN2, BAP1, BARD1, BLM, BMPR1A, BRCA1, BRCA2, BRIP1, CDC73, CDH1, CDK4, CDKN1B, CDKN2A, CHEK2, CTNNA1, DICER1, FANCC, FH, FLCN, GALNT12, KIF1B, LZTR1, MAX, MEN1, MET, MLH1, MSH2, MSH3, MSH6, MUTYH, NBN, NF1, NF2, NTHL1, PALB2, PHOX2B, PMS2, POT1, PRKAR1A, PTCH1, PTEN, RAD51C, RAD51D, RB1, RECQL, RET, SDHA, SDHAF2, SDHB,  SDHC, SDHD, SMAD4, SMARCA4, SMARCB1, SMARCE1, STK11, SUFU, TMEM127, TP53, TSC1, TSC2, VHL and XRCC2 (sequencing and deletion/duplication); EGFR, EGLN1, HOXB13, KIT, MITF, PDGFRA, POLD1, and POLE (sequencing only); EPCAM and GREM1 (deletion/duplication only).      Ms. Dilmore is unaware of other previous family history of genetic testing for hereditary cancer risks. There is no reported Ashkenazi Jewish ancestry. There is no known consanguinity.  GENETIC TEST RESULTS:  The [facility] Multi-Cancer +RNA Panel found no pathogenic mutations.   The Multi-Cancer + RNA Panel offered by [facility] includes sequencing and/or deletion/duplication analysis of the following 84 genes:  AIP*, ALK, APC*, ATM*, AXIN2*, BAP1*, BARD1*, BLM*, BMPR1A*, BRCA1*, BRCA2*, BRIP1*, CASR, CDC73*, CDH1*, CDK4, CDKN1B*, CDKN1C*, CDKN2A, CEBPA, CHEK2*, CTNNA1*, DICER1*, DIS3L2*, EGFR, EPCAM, FH*, FLCN*, GATA2*, GPC3, GREM1, HOXB13, HRAS, KIT, MAX*, MEN1*, MET, MITF, MLH1*, MSH2*, MSH3*, MSH6*, MUTYH*, NBN*, NF1*, NF2*, NTHL1*, PALB2*, PDGFRA, PHOX2B, PMS2*, POLD1*, POLE*, POT1*, PRKAR1A*, PTCH1*, PTEN*, RAD50*, RAD51C*, RAD51D*, RB1*, RECQL4, RET, RUNX1*, SDHA*, SDHAF2*, SDHB*, SDHC*, SDHD*, SMAD4*, SMARCA4*, SMARCB1*, SMARCE1*, STK11*, SUFU*, TERC, TERT, TMEM127*, Tp53*, TSC1*, TSC2*, VHL*, WRN*, and WT1.  RNA analysis is performed for * genes. .   The test report has been scanned into EPIC and is located under the Molecular Pathology section of the Results Review tab.  A portion of the result report  is included below for reference. Genetic testing reported out on September 25, 2022.     Genetic testing identified a variant of uncertain significance (VUS) in the CTNNA1 gene called c.506A>G (p.Asn169Ser).  At this time, it is unknown if this variant is associated with an increased risk for cancer or if it is benign, but most uncertain variants are reclassified to benign. It should not be used to make medical management decisions. With time,  we suspect the laboratory will determine the significance of this variant, if any. If the laboratory reclassifies this variant, we will attempt to contact Ms. Fewell to discuss it further.    We recommended Ms. Reede pursue testing for the familial hereditary cancer gene mutation called CHEK2 c.1100delC. Ms. Estabrooks test was normal and did not reveal the familial mutation. We call this result a true negative result because a cancer-causing mutation was identified in Ms. Wendt family and she did not inherit it.  Given this negative result, Ms. Gilden chances of developing CHEK2-related cancers are about the same as they are in the general population.    Even though a pathogenic variant was not identified, possible explanations for cancers in her siblings and maternal/paternal relatives include:  There may be no other hereditary risk for cancer in the family. The cancers family (besides her daughter with the CHEK2 mutation) may be sporadic/familial or due to other genetic and environmental factors. There may be a gene mutation in one of these genes that current testing methods cannot detect but that chance is small. There could be another gene that has not yet been discovered, or that we have not yet tested, that is responsible for the cancer diagnoses in the family.  It is also possible there is a hereditary cause for the cancer in the family that Ms. Fanara did not inherit.   Therefore, it is important to remain in touch with cancer genetics in the future so that we can continue to offer Ms. Sestak the most up to date genetic testing.     ADDITIONAL GENETIC TESTING:  We discussed with Ms. Hine that her genetic testing was fairly extensive.  If there are additional relevant genes identified to increase cancer risk that can be analyzed in the future, we would be happy to discuss and coordinate this testing at that time.     CANCER SCREENING RECOMMENDATIONS:  Ms. Brophy test result is considered  negative (normal).  This means that we have not identified a hereditary cause for her family history of cancer at this time.  The CHEK2 mutation, which was detected in her daughter, was not detected in her sample.  Thus, CHEK2-specific cancer screening is not indicated based on Ms. Soter's result.   An individual's cancer risk and medical management are not determined by genetic test results alone. Overall cancer risk assessment incorporates additional factors, including personal medical history, family history, and any available genetic information that may result in a personalized plan for cancer prevention and surveillance. Therefore, it is recommended she continue to follow the cancer management and screening guidelines provided by her  primary healthcare provider.  Based on the reported personal and family history, specific cancer screenings for Ms. Cherie Ouch:  Colonoscopies at least every 5 years, or as recommended by her gastroenterologist, given her brother's history of colon cancer    RECOMMENDATIONS FOR FAMILY MEMBERS:   Ms. Knueppel negative CHEK2 result is highly suggestive of her daughter's paternal family carrying the known CHEK2 mutation.  We recommended genetic testing  for her daughter's paternal relatives, especially her father and her brother.  Individuals in the family should notify their providers of the family history of cancer. We recommend women in this family have a yearly mammogram beginning at age 3, or 64 years younger than the earliest onset of cancer, an annual clinical breast exam, and perform monthly breast self-exams.   First degree relatives of those with colon cancer should receive colonoscopies beginning at age 61, or 10 years prior to the earliest diagnosis of colon cancer in the family, and receive colonoscopies at least every 5 years, or as recommended by their gastroenterologist.   Other members of the family may still carry a pathogenic variant in one of these  genes that Ms. Hoopes did not inherit. Based on the family history, we recommend her brother, who was diagnosed with colon cancer in his 101s, have genetic counseling and testing. Ms. Coggeshall will let us know if we can be of any assistance in coordinating genetic counseling and/or testing for this family member.   We do not recommend familial testing for the CTNNA1 variant of uncertain significance (VUS).  FOLLOW-UP:  Lastly, we discussed with Ms. Heyer that cancer genetics is a rapidly advancing field and it is possible that new genetic tests will be appropriate for her and/or her family members in the future. We encouraged her to remain in contact with cancer genetics on an annual basis so we can update her personal and family histories and let her know of advances in cancer genetics that may benefit this family.   Our contact number was provided. Ms. Mondor questions were answered to her satisfaction, and she knows she is welcome to call us at anytime with additional questions or concerns.   Ryott Rafferty M. Joette Catching, [place], [facility] Genetic Counselor [email or web address] (P) [phone]

## 2023-01-24 ENCOUNTER — Ambulatory Visit: Payer: PPO | Attending: Cardiology | Admitting: Cardiology

## 2023-01-24 ENCOUNTER — Encounter: Payer: Self-pay | Admitting: Cardiology

## 2023-01-24 VITALS — BP 150/98 | HR 72 | Ht 62.0 in | Wt 157.0 lb

## 2023-01-24 DIAGNOSIS — R42 Dizziness and giddiness: Secondary | ICD-10-CM

## 2023-01-24 DIAGNOSIS — I10 Essential (primary) hypertension: Secondary | ICD-10-CM

## 2023-01-24 DIAGNOSIS — E782 Mixed hyperlipidemia: Secondary | ICD-10-CM | POA: Diagnosis not present

## 2023-01-24 NOTE — Patient Instructions (Signed)

## 2023-01-24 NOTE — Progress Notes (Unsigned)
Cardiology Office Note:    Date:  01/24/2023   ID:  Wendy Diaz, DOB 18-May-1951, MRN [id number]  PCP:  Wendy Camel, NP  Cardiologist:  Jenne Campus, MD    Referring MD: Bess Harvest*   Chief Complaint  Patient presents with   Follow-up    History of Present Illness:    Wendy Diaz is a 72 y.o. female with past medical history significant for essential hypertension which is difficult to manage because of intolerance to multiple medication, dyslipidemia, obstructive sleep apnea she comes today to my office that she brought all medication she tried for her high blood pressure there is valsartan, there is losartan, there is amlodipine, hydrochlorothiazide she said as she could not tolerate any of this she said when she had those medication on board she will get dizzy and simply does not feel well.  So she stopped all his medications she is on impression that her blood pressure is fine because blood pressure at home is 150/90 I told her that blood pressure is too high.  She was also diagnosed with sleep apnea and started using CPAP mask since beginning of December but does have difficult time to adjust mask and proper setting on the machine.  Overall she said she feels much better with CPAP mask but have difficulty tolerating it.  Past Medical History:  Diagnosis Date   Aortic atherosclerosis ([place]) 04/05/2016   Formatting of this note might be different from the original. Seen on CT of Chest in 03/2016.   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 CT scan March 2023 showed stability 3.7 cm   Episodic recurrent vertigo 10/21/2018   Essential hypertension 02/09/2016   Formatting of this note might be different from the original. signficant difficulty with BP meds and side  effects  Last Assessment & Plan:  Formatting of this note might be different from the original. Relevant Hx: Course: Daily Update: Today's Plan:she has checked outside of here and she has better readings will follow  Electronically signed by: Wendy Camel, NP 02/09/16 2136   Ganglion cyst of right foot 04/08/2017   GERD without esophagitis 02/09/2016   Last Assessment & Plan:  Formatting of this note might be different from the original. Relevant Hx: Course: Daily Update: Today's Plan:this has been stable for her and will follow  Electronically signed by: Wendy Camel, NP 02/09/16 2136   Hyperthyroidism 09/10/2022   Hypothyroidism (acquired) 02/09/2016   Last Assessment & Plan:  Formatting of this note might be different from the original. Relevant Hx: Course: Daily Update: Today's Plan:update her level on her current dose of med  Electronically signed by: Wendy Camel, NP 02/09/16 2137   Irritable bowel syndrome with constipation 02/09/2016   Last Assessment & Plan:  Formatting of this note might be different from the original. 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     Allergies:   Patient has no known allergies.   Social History   Socioeconomic History   Marital status: Single    Spouse name: Not on file   Number of children: Not on file   Years of education: Not on file   Highest education level: Not on file  Occupational History   Not on file  Tobacco Use   Smoking status: Never   Smokeless tobacco: Never  Substance and Sexual Activity   Alcohol use: Never   Drug use: Not on file   Sexual activity: Not on file  Other Topics Concern   Not on file  Social History Narrative   Not on file   Social Determinants of Health   Financial Resource Strain: Not on file  Food Insecurity: Not on file  Transportation Needs: Not on file  Physical Activity: Not on file  Stress: Not on file  Social Connections: Not on file     Family History: The patient's family history includes Breast cancer in her maternal aunt; Breast cancer (age of onset: 15) in her daughter; Colon cancer (age of onset: 51) in her brother; Colon polyps in her father; Heart attack in her father; Liver cancer in her paternal grandmother; Other in her brother; Pancreatic cancer in her maternal grandmother; Skin cancer in her father; Stroke in  her mother; Throat cancer in her brother. ROS:   Please see the history of present illness.    All 14 point review of systems negative except as described per history of present illness  EKGs/Labs/Other Studies Reviewed:      Recent Labs: No results found for requested labs within last 365 days.  Recent Lipid Panel No results found for: "CHOL", "TRIG", "HDL", "CHOLHDL", "VLDL", "LDLCALC", "LDLDIRECT"  Physical Exam:    VS:  BP (!) 150/98 (BP Location: Left Arm, Patient Position: Sitting)   Pulse 72   Ht '5\' 2"'$  (1.575 m)   Wt 157 lb (71.2 kg)   SpO2 93%   BMI 28.72 kg/m     Wt Readings from Last 3 Encounters:  01/24/23 157 lb (71.2 kg)  10/24/22 151 lb (68.5 kg)  09/12/22 153 lb (69.4 kg)     GEN:  Well nourished, well developed in no acute distress HEENT: Normal NECK: No JVD; No carotid bruits  LYMPHATICS: No lymphadenopathy CARDIAC: RRR, no murmurs, no rubs, no gallops RESPIRATORY:  Clear to auscultation without rales, wheezing or rhonchi  ABDOMEN: Soft, non-tender, non-distended MUSCULOSKELETAL:  No edema; No deformity  SKIN: Warm and dry LOWER EXTREMITIES: no swelling NEUROLOGIC:  Alert and oriented x 3 PSYCHIATRIC:  Normal affect   ASSESSMENT:    1. Essential hypertension   2. Dizziness   3. Mixed hyperlipidemia    PLAN:    In order of problems listed above:  Essential hypertension in unable to tolerate multiple medications.  Today she complained of having some swelling of lower extremities and she decided to take hydrochlorothiazide that should help with the blood pressure and hopefully helps with the swelling.  Hopefully she will be able to tolerate that medication.  1 more time we reviewed nonpharmacological ways to manage her high blood pressure I think proper management of sleep apnea will be also essential.  She does have appoint with our sleep specialist and she is anxiously awaiting that appointment. Dizziness does not look likely related to her  heart. Dyslipidemia: I did review her fasting lipid profile from primary care physician, LDL 104 HDL 58.  She is taking Mevacor which I will continue for now.  Will recheck her fasting lipid profile   Medication Adjustments/Labs and Tests Ordered: Current medicines are reviewed at length with the patient today.  Concerns regarding medicines are outlined above.  No orders of the defined types were placed in this encounter.  Medication changes: No orders of the defined types were placed in this encounter.   Signed, Park Liter, MD, [facility] 01/24/2023 9:31 AM    [place]

## 2023-02-04 ENCOUNTER — Telehealth: Payer: Self-pay | Admitting: *Deleted

## 2023-02-04 ENCOUNTER — Ambulatory Visit: Payer: PPO | Attending: Cardiology | Admitting: Cardiology

## 2023-02-04 VITALS — BP 150/79 | HR 59 | Ht 62.0 in | Wt 155.0 lb

## 2023-02-04 DIAGNOSIS — R400 Somnolence: Secondary | ICD-10-CM

## 2023-02-04 DIAGNOSIS — G4733 Obstructive sleep apnea (adult) (pediatric): Secondary | ICD-10-CM | POA: Diagnosis not present

## 2023-02-04 DIAGNOSIS — I10 Essential (primary) hypertension: Secondary | ICD-10-CM

## 2023-02-04 DIAGNOSIS — R0683 Snoring: Secondary | ICD-10-CM

## 2023-02-04 NOTE — Progress Notes (Signed)
SLEEP MEDICINE VIRTUAL CONSULT NOTE via Video Note   Because of Wendy Diaz's co-morbid illnesses, she is at least at moderate risk for complications without adequate follow up.  This format is felt to be most appropriate for this patient at this time.  All issues noted in this document were discussed and addressed.  A limited physical exam was performed with this format.  Please refer to the patient's chart for her consent to telehealth for [facility].      Date:  02/04/2023   ID:  Wendy Diaz, DOB 1951/03/03, MRN [id number] The patient was identified using 2 identifiers.  Patient Location: Home Provider Location: Office/Clinic   PCP:  Mayer Camel, NP   [place] Providers Cardiologist:  None     Evaluation Performed:  New Patient Evaluation  Chief Complaint:  OSA  History of Present Illness:    Wendy Diaz is a 72 y.o. female who is being seen today for the evaluation of OSA  at the request of Tristan Schroeder, MD.  Wendy Diaz is a 72 y.o. female with aortic atherosclerosis, hypertension, GERD, hyperlipidemia who was seen by Dr. Agustin Cree back in the fall for hypertension.  Due to her hypertension Dr. Agustin Cree ordered a sleep study.  Home sleep study showed moderate obstructive sleep apnea with an AHI of 16.4/h and no significant central events.  There was nocturnal hypoxemia with lowest O2 saturation 77%.  She was started on auto CPAP from 4 to 15 cm H2O.  She is now referred to establish care with sleep medicine for treatment of obstructive sleep apnea  She says that CPAP is terrible and has been a big struggle.  She started with a FFM and it did not fit properly.  She was then measured and told she had the wrong mask.  She then tried the under the nose FFM which is better but still struggles because the hose comes off the mask and the hose is very long.  Sometimes she cannot feel that she is getting air.  She is doing well with  her PAP device and thinks that she has gotten used to it.  She tolerates the mask and feels the pressure is adequate.  Since going on PAP she feels rested in the am and has no significant daytime sleepiness.  She has had problems with significant mouth and nasal dryness.  She does not think that he snores.  She also is feeling bloated when she wakes up in the am.   Past Medical History:  Diagnosis Date   Aortic atherosclerosis ([place]) 04/05/2016   Formatting of this note might be different from the original. Seen on CT of Chest in 03/2016.   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 CT scan March 2023 showed stability 3.7 cm   Episodic recurrent vertigo 10/21/2018   Essential hypertension 02/09/2016   Formatting of this note might be different from the original. signficant difficulty with BP meds and side effects  Last Assessment & Plan:  Formatting of this note might be different from the original. Relevant Hx: Course: Daily Update: Today's Plan:she has checked outside of here and she has better readings will follow  Electronically signed by: Mayer Camel, NP 02/09/16 2136   Ganglion cyst of right foot 04/08/2017  GERD without esophagitis 02/09/2016   Last Assessment & Plan:  Formatting of this note might be different from the original. Relevant Hx: Course: Daily Update: Today's Plan:this has been stable for her and will follow  Electronically signed by: Mayer Camel, NP 02/09/16 2136   Hyperthyroidism 09/10/2022   Hypothyroidism (acquired) 02/09/2016   Last Assessment & Plan:  Formatting of this note might be different from the original. Relevant Hx: Course: Daily Update: Today's Plan:update her level on her current dose of med  Electronically signed by: Mayer Camel, NP  02/09/16 2137   Irritable bowel syndrome with constipation 02/09/2016   Last Assessment & Plan:  Formatting of this note might be different from the original. 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 Relevant Hx: Course: Daily Update: Today's Plan:2016 dexa with -1.7 t score femur, and taking calcium and vitamin D  Electronically signed by: Mayer Camel, NP 02/09/16 1101   Paget's disease of bone 02/17/2018   Formatting of this note might be different from the original. 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 Relevant Hx: Course: Daily Update: Today's Plan:there are some issues at home but she is working through those as well as at work and with her mother who is in a nursing home continue with her med  Electronically signed by: Mayer Camel, NP 02/09/16 2139   Recurrent dry cough 02/11/2022   Trochanteric bursitis of left hip 07/06/2020   White matter abnormality on MRI of brain 04/30/2022   Past Surgical History:  Procedure Laterality Date   ABDOMINAL HYSTERECTOMY     BUNIONECTOMY Bilateral    GANGLION CYST EXCISION     Left wrist and Right great toe X2   LAMINECTOMY       Current Meds  Medication Sig   Cholecalciferol 50 MCG (2000 UT) CAPS Take 2,000 Units by mouth daily.   estradiol (ESTRACE) 0.5 MG tablet Take 0.5 mg by mouth daily.   levothyroxine (SYNTHROID) 50 MCG tablet Take 50 mcg by mouth daily.   lovastatin (MEVACOR) 10 MG tablet Take 1 tablet by mouth daily.   sertraline  (ZOLOFT) 100 MG tablet Take 100 mg by mouth daily.     Allergies:   Patient has no known allergies.   Social History   Tobacco Use   Smoking status: Never   Smokeless tobacco: Never  Substance Use Topics   Alcohol use: Never     Family Hx: The patient's family history includes Breast cancer in her maternal aunt; Breast cancer (age of onset: 31) in her daughter; Colon cancer (age of onset: 58) in her brother; Colon polyps in her father; Heart attack in her father; Liver cancer in her paternal grandmother; Other in her brother; Pancreatic cancer in her maternal grandmother; Skin cancer in her father; Stroke in her mother; Throat cancer in her brother.  ROS:   Please see the history of present illness.     All other systems reviewed and are negative.   Prior Sleep studies:  The following studies were reviewed today:  HST, PAP compliance download  Labs/Other Tests and Data Reviewed:     Recent Labs: No results found for requested labs within last 365 days.    Wt Readings from Last 3 Encounters:  02/04/23 155 lb (70.3 kg)  01/24/23 157 lb (71.2 kg)  10/24/22 151 lb (68.5 kg)     Risk Assessment/Calculations:      STOP-Bang Score:  4      Objective:    Vital Signs:  BP (!) 150/79   Pulse (!) 59   Ht 5' 2"$  (1.575 m)   Wt 155 lb (70.3 kg)   BMI 28.35 kg/m    VITAL SIGNS:  reviewed GEN:  no acute distress EYES:  sclerae anicteric, EOMI - Extraocular Movements Intact RESPIRATORY:  normal respiratory effort, symmetric expansion CARDIOVASCULAR:  no peripheral edema SKIN:  no rash, lesions or ulcers. MUSCULOSKELETAL:  no obvious deformities. NEURO:  alert and oriented x 3, no obvious focal deficit PSYCH:  normal affect  ASSESSMENT & PLAN:    OSA - The PAP download performed by his DME was personally reviewed and interpreted by me today and showed an AHI of 3.3/hr on auto CPAP from 4 to 15 cm H2O with 93% compliance in using more than 4 hours nightly.  The patient  has been using and benefiting from PAP use and will continue to benefit from therapy.  -she is not tolerating the CPAP because the mask continually leaks and the pressure is high and and the mask and tubing bother her. I have encouraged her to try to increase the humidity to help with mouth dryness but if she gets water or condensation in the tubing she needs to back off -I will decrease the auto CPAP to 4-12cm H2O to help with blowing into her face as well as abdominal bloating -she is struggling with her mask so will try a nasal pillow mask with chin strap and get a download in 4 weeks  Time:   Today, I have spent 15 minutes with the patient with telehealth technology discussing the above problems.     Medication Adjustments/Labs and Tests Ordered: Current medicines are reviewed at length with the patient today.  Concerns regarding medicines are outlined above.   Tests Ordered: No orders of the defined types were placed in this encounter.   Medication Changes: No orders of the defined types were placed in this encounter.   Follow Up:  In Person in 3 month(s)  Signed, Fransico Him, MD  02/04/2023 1:54 PM    [place]

## 2023-02-04 NOTE — Patient Instructions (Signed)
Medication Instructions:  Your physician recommends that you continue on your current medications as directed. Please refer to the Current Medication list given to you today.  *If you need a refill on your cardiac medications before your next appointment, please call your pharmacy*   Lab Work: None.  If you have labs (blood work) drawn today and your tests are completely normal, you will receive your results only by: [place] (if you have MyChart) OR A paper copy in the mail If you have any lab test that is abnormal or we need to change your treatment, we will call you to review the results.   Testing/Procedures: None.   Follow-Up: At [facility], you and your health needs are our priority.  As part of our continuing mission to provide you with exceptional heart care, we have created designated Provider Care Teams.  These Care Teams include your primary Cardiologist (physician) and Advanced Practice Providers (APPs -  Physician Assistants and Nurse Practitioners) who all work together to provide you with the care you need, when you need it.  We recommend signing up for the patient portal called "MyChart".  Sign up information is provided on this After Visit Summary.  MyChart is used to connect with patients for Virtual Visits (Telemedicine).  Patients are able to view lab/test results, encounter notes, upcoming appointments, etc.  Non-urgent messages can be sent to your provider as well.   To learn more about what you can do with MyChart, go to [email or web address].    Your next appointment will be as needed and it will be with:    Provider:   Dr. Fransico Him, MD

## 2023-02-04 NOTE — Telephone Encounter (Signed)
Per Dr Radford Pax, Decrease auto CPAP to 4 to 12cm H2O and get a download in 4 weeks -   also order a nasal pillow mask with chin strap and get a download in 4 weeks - followup with me in 3 months

## 2023-02-04 NOTE — Telephone Encounter (Signed)
Order placed to [facility] via community message. 

## 2023-02-18 ENCOUNTER — Telehealth: Payer: PPO | Admitting: Cardiology

## 2023-04-29 ENCOUNTER — Ambulatory Visit: Payer: PPO | Attending: Cardiology | Admitting: Cardiology

## 2023-04-29 ENCOUNTER — Encounter: Payer: Self-pay | Admitting: Cardiology

## 2023-04-29 VITALS — BP 164/90 | HR 59 | Ht 62.0 in | Wt 162.6 lb

## 2023-04-29 DIAGNOSIS — E782 Mixed hyperlipidemia: Secondary | ICD-10-CM | POA: Diagnosis not present

## 2023-04-29 DIAGNOSIS — R0609 Other forms of dyspnea: Secondary | ICD-10-CM | POA: Diagnosis not present

## 2023-04-29 DIAGNOSIS — R42 Dizziness and giddiness: Secondary | ICD-10-CM

## 2023-04-29 DIAGNOSIS — I10 Essential (primary) hypertension: Secondary | ICD-10-CM

## 2023-04-29 NOTE — Patient Instructions (Addendum)
Medication Instructions:  Your physician recommends that you continue on your current medications as directed. Please refer to the Current Medication list given to you today.  *If you need a refill on your cardiac medications before your next appointment, please call your pharmacy*   Lab Work:  ProBNP- today  Your physician recommends that you return for lab work in: when needed You need to have labs done when you are fasting.  You can come Monday through Friday 8:30 am to 12:00 pm and 1:15 to 4:30. You do not need to make an appointment as the order has already been placed. The labs you are going to have done are BMET   Testing/Procedures: None Ordered   Follow-Up: At [facility], you and your health needs are our priority.  As part of our continuing mission to provide you with exceptional heart care, we have created designated Provider Care Teams.  These Care Teams include your primary Cardiologist (physician) and Advanced Practice Providers (APPs -  Physician Assistants and Nurse Practitioners) who all work together to provide you with the care you need, when you need it.  We recommend signing up for the patient portal called "MyChart".  Sign up information is provided on this After Visit Summary.  MyChart is used to connect with patients for Virtual Visits (Telemedicine).  Patients are able to view lab/test results, encounter notes, upcoming appointments, etc.  Non-urgent messages can be sent to your provider as well.   To learn more about what you can do with MyChart, go to [email or web address].    Your next appointment:   3 month(s)  The format for your next appointment:   In Person  Provider:   Gypsy Balsam, MD    Other Instructions NA

## 2023-04-29 NOTE — Progress Notes (Unsigned)
Cardiology Office Note:    Date:  04/29/2023   ID:  TIPHANI VILLASANA, DOB 1951/10/03, MRN [id number]  PCP:  Krystal Clark, NP  Cardiologist:  Gypsy Balsam, MD    Referring MD: Rhea Bleacher*   Chief Complaint  Patient presents with   Weight Gain   Foot Swelling    On furosemide 10 mg qhs    History of Present Illness:    Wendy Diaz is a 72 y.o. female with past medical history significant for essential hypertension which is difficult to control because of intolerance to multiple medications, sleep apnea.  She comes today to months for follow-up.  She did notice swelling of lower extremities at evening time.  She started taking hydrochlorothiazide with not much response.  Then she was given Lasix only 10 mg by her primary care physician she said still be better but still 1+ edema.  She denies have any chest pain tightness squeezing pressure burning chest.  She blames swelling of lower extremities the fact that she works now 5 times a week and she sits all the time she works 8-5 and after that she goes to the football game with her grandson which again involves sitting.  She also said that sometimes she eats what ever she is able to do which many times is very salty.  Past Medical History:  Diagnosis Date   Aortic atherosclerosis ([facility]) 04/05/2016   Formatting of this note might be different from the original. Seen on CT of Chest in 03/2016.   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 CT scan March 2023 showed stability 3.7 cm   Episodic recurrent vertigo 10/21/2018   Essential hypertension 02/09/2016   Formatting of this note might be different from the original. signficant difficulty with BP meds and side effects  Last Assessment & Plan:  Formatting of this note might be  different from the original. Relevant Hx: Course: Daily Update: Today's Plan:she has checked outside of here and she has better readings will follow  Electronically signed by: Krystal Clark, NP 02/09/16 2136   Ganglion cyst of right foot 04/08/2017   GERD without esophagitis 02/09/2016   Last Assessment & Plan:  Formatting of this note might be different from the original. Relevant Hx: Course: Daily Update: Today's Plan:this has been stable for her and will follow  Electronically signed by: Krystal Clark, NP 02/09/16 2136   Hyperthyroidism 09/10/2022   Hypothyroidism (acquired) 02/09/2016   Last Assessment & Plan:  Formatting of this note might be different from the original. Relevant Hx: Course: Daily Update: Today's Plan:update her level on her current dose of med  Electronically signed by: Krystal Clark, NP 02/09/16 2137   Irritable bowel syndrome with constipation 02/09/2016   Last Assessment & Plan:  Formatting of this note might be different from the original. 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 Relevant Hx: Course: Daily Update: Today's Plan:2016 dexa with -  1.7 t score femur, and taking calcium and vitamin D  Electronically signed by: Krystal Clark, NP 02/09/16 1101   Paget's disease of bone 02/17/2018   Formatting of this note might be different from the original. 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 Relevant Hx: Course: Daily Update: Today's Plan:there are some  issues at home but she is working through those as well as at work and with her mother who is in a nursing home continue with her med  Electronically signed by: Krystal Clark, NP 02/09/16 2139   Recurrent dry cough 02/11/2022   Trochanteric bursitis of left hip 07/06/2020   White matter abnormality on MRI of brain 04/30/2022    Past Surgical History:  Procedure Laterality Date   ABDOMINAL HYSTERECTOMY     BUNIONECTOMY Bilateral    GANGLION CYST EXCISION     Left wrist and Right great toe X2   LAMINECTOMY      Current Medications: Current Meds  Medication Sig   calcium carbonate (CALCIUM 600) 600 MG TABS tablet Take 600 mg by mouth daily.   Cholecalciferol 50 MCG (2000 UT) CAPS Take 2,000 Units by mouth daily.   estradiol (ESTRACE) 0.5 MG tablet Take 0.5 mg by mouth daily.   furosemide (LASIX) 20 MG tablet Take 10 mg by mouth at bedtime.   levothyroxine (SYNTHROID) 50 MCG tablet Take 50 mcg by mouth daily.   lovastatin (MEVACOR) 10 MG tablet Take 1 tablet by mouth daily.   sertraline (ZOLOFT) 100 MG tablet Take 100 mg by mouth daily.     Allergies:   Patient has no known allergies.   Social History   Socioeconomic History   Marital status: Single    Spouse name: Not on file   Number of children: Not on file   Years of education: Not on file   Highest education level: Not on file  Occupational History   Not on file  Tobacco Use   Smoking status: Never   Smokeless tobacco: Never  Substance and Sexual Activity   Alcohol use: Never   Drug use: Not on file   Sexual activity: Not on file  Other Topics Concern   Not on file  Social History Narrative   Not on file   Social Determinants of Health   Financial Resource Strain: Not on file  Food Insecurity: Not on file  Transportation Needs: Not on file  Physical Activity: Not on file  Stress: Not on file  Social Connections: Not on file     Family History: The patient's family history includes Breast cancer  in her maternal aunt; Breast cancer (age of onset: 67) in her daughter; Colon cancer (age of onset: 58) in her brother; Colon polyps in her father; Heart attack in her father; Liver cancer in her paternal grandmother; Other in her brother; Pancreatic cancer in her maternal grandmother; Skin cancer in her father; Stroke in her mother; Throat cancer in her brother. ROS:   Please see the history of present illness.    All 14 point review of systems negative except as described per history of present illness  EKGs/Labs/Other Studies Reviewed:      Recent Labs: No results found for requested labs within last 365 days.  Recent Lipid Panel No results found for: "CHOL", "TRIG", "HDL", "CHOLHDL", "VLDL", "LDLCALC", "LDLDIRECT"  Physical Exam:    VS:  BP (!) 150/88 (BP Location:  Left Arm, Patient Position: Sitting)   Pulse (!) 59   Ht 5\' 2"  (1.575 m)   Wt 162 lb 9.6 oz (73.8 kg)   SpO2 98%   BMI 29.74 kg/m     Wt Readings from Last 3 Encounters:  04/29/23 162 lb 9.6 oz (73.8 kg)  02/04/23 155 lb (70.3 kg)  01/24/23 157 lb (71.2 kg)     GEN:  Well nourished, well developed in no acute distress HEENT: Normal NECK: No JVD; No carotid bruits LYMPHATICS: No lymphadenopathy CARDIAC: RRR, no murmurs, no rubs, no gallops RESPIRATORY:  Clear to auscultation without rales, wheezing or rhonchi  ABDOMEN: Soft, non-tender, non-distended MUSCULOSKELETAL:  No edema; No deformity  SKIN: Warm and dry LOWER EXTREMITIES: no swelling NEUROLOGIC:  Alert and oriented x 3 PSYCHIATRIC:  Normal affect   ASSESSMENT:    1. Essential hypertension   2. Dizziness   3. Mixed hyperlipidemia    PLAN:    In order of problems listed above:  Essential hypertension still uncontrolled.  Will increase dose of Lasix to 20 mg daily.  Will check proBNP as well as echocardiogram because of swelling of lower extremities. Dizziness denies having any. Mixed dyslipidemia I did review her blood work done by primary  care physician her LDL is 87 HDL 52 we will continue present management   Medication Adjustments/Labs and Tests Ordered: Current medicines are reviewed at length with the patient today.  Concerns regarding medicines are outlined above.  Orders Placed This Encounter  Procedures   Basic metabolic panel   Medication changes: No orders of the defined types were placed in this encounter.   Signed, Georgeanna Lea, MD, [facility] 04/29/2023 11:56 AM    [facility]

## 2023-04-30 LAB — PRO B NATRIURETIC PEPTIDE: NT-Pro BNP: 107 pg/mL (ref 0–301)

## 2023-05-02 ENCOUNTER — Telehealth: Payer: Self-pay

## 2023-05-02 NOTE — Telephone Encounter (Signed)
Results reviewed with pt as per Dr. Krasowski's note.  Pt verbalized understanding and had no additional questions. Routed to PCP  

## 2023-05-28 ENCOUNTER — Ambulatory Visit: Payer: PPO | Attending: Cardiology

## 2023-05-28 DIAGNOSIS — R0609 Other forms of dyspnea: Secondary | ICD-10-CM | POA: Diagnosis not present

## 2023-05-28 LAB — ECHOCARDIOGRAM COMPLETE
P 1/2 time: 618 msec
S' Lateral: 3.1 cm

## 2023-07-30 ENCOUNTER — Ambulatory Visit: Payer: PPO | Admitting: Cardiology

## 2023-07-31 ENCOUNTER — Telehealth: Payer: Self-pay | Admitting: *Deleted

## 2023-07-31 ENCOUNTER — Encounter: Payer: Self-pay | Admitting: Cardiology

## 2023-07-31 ENCOUNTER — Ambulatory Visit: Payer: PPO | Attending: Cardiology | Admitting: Cardiology

## 2023-07-31 VITALS — BP 158/64 | HR 61 | Ht 62.0 in | Wt 164.0 lb

## 2023-07-31 DIAGNOSIS — E782 Mixed hyperlipidemia: Secondary | ICD-10-CM

## 2023-07-31 DIAGNOSIS — I700 Atherosclerosis of aorta: Secondary | ICD-10-CM

## 2023-07-31 MED ORDER — SPIRONOLACTONE 25 MG PO TABS: 12.5000 mg | ORAL_TABLET | Freq: Every day | ORAL | 3 refills | Status: DC

## 2023-07-31 NOTE — Telephone Encounter (Signed)
Yes, here is her download she only has 1.4 apneas per hour. It is in normal range when it is 5 times per hour or less.  Wendy Diaz, Wendy Diaz 07/01/2023 - 07/30/2023 Patient ID: [id number] DOB: 11-10-1951 Age: 72 years 30.12 [place] [employer] Compliance Report Usage 07/01/2023 - 07/30/2023 Usage days 25/30 days (83%) >= 4 hours 23 days (77%) < 4 hours 2 days (7%) Usage hours 179 hours 17 minutes Average usage (total days) 5 hours 59 minutes Average usage (days used) 7 hours 10 minutes Median usage (days used) 7 hours 33 minutes Total used hours (value since last reset - 07/30/2023) 1,768 hours AirSense 11 AutoSet Serial number [id number] Mode AutoSet Min Pressure 4 cmH2O Max Pressure 15 cmH2O EPR Fulltime EPR level 3 Response Soft Therapy Pressure - cmH2O Median: 8.5 95th percentile: 11.1 Maximum: 11.9 Leaks - L/min Median: 9.1 95th percentile: 27.1 Maximum: 38.4 Events per hour AI: 1.2 HI: 0.2 AHI: 1.4 Apnea Index Central: 0.6 Obstructive: 0.5 Unknown: 0.1 RERA Index 0.4 Cheyne-Stokes respiration (average duration per night) 0 minutes (0%) Usage - hours Printed on 07/31/2023 - [employer]Med AirView version 4.45.0-4.0 Page 1 of 1

## 2023-07-31 NOTE — Progress Notes (Signed)
Cardiology Office Note:    Date:  07/31/2023   ID:  EMAYA HERSKOWITZ, DOB [birth year], MRN [id number]  PCP:  Krystal Clark, NP  Cardiologist:  Gypsy Balsam, MD    Referring MD: Rhea Bleacher*   Chief Complaint  Patient presents with   Edema    History of Present Illness:    Wendy Diaz is a 72 y.o. female past medical history significant for essential hypertension which is difficult to control with intolerance to multiple medication, obstructive sleep apnea, swelling of lower extremities.  Swelling of lower extremities what evening time.  She was given Lasix 10 mg daily and increase Lasix to 12 mg interestingly she tell me that a higher dose of Lasix made her swell up much worse.  So she stopped it.  She is elastic stocking which seems to be helping also when she is moving around or walking swelling is not that bad.  When she sits for long period of time swelling will be significant.  Still having some difficulty with high blood pressure.  She was diagnosed with sleep apnea she had difficulty finding appropriate equipment now seems to be doing quite well but I am wondering if difficulty control her blood pressure as well as swelling of lower extremities could be related to still not adequately controlled sleep apnea.  Past Medical History:  Diagnosis Date   Aortic atherosclerosis (HCC) 04/05/2016   Formatting of this note might be different from the original. Seen on CT of Chest in 03/2016.   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 CT scan March 2023 showed stability 3.7 cm   Episodic recurrent vertigo 10/21/2018   Essential hypertension 02/09/2016   Formatting of this note might be different from the original. signficant difficulty with BP meds and side effects  Last  Assessment & Plan:  Formatting of this note might be different from the original. Relevant Hx: Course: Daily Update: Today's Plan:she has checked outside of here and she has better readings will follow  Electronically signed by: Krystal Clark, NP 02/09/16 2136   Ganglion cyst of right foot 04/08/2017   GERD without esophagitis 02/09/2016   Last Assessment & Plan:  Formatting of this note might be different from the original. Relevant Hx: Course: Daily Update: Today's Plan:this has been stable for her and will follow  Electronically signed by: Krystal Clark, NP 02/09/16 2136   Hyperthyroidism 09/10/2022   Hypothyroidism (acquired) 02/09/2016   Last Assessment & Plan:  Formatting of this note might be different from the original. Relevant Hx: Course: Daily Update: Today's Plan:update her level on her current dose of med  Electronically signed by: Krystal Clark, NP 02/09/16 2137   Irritable bowel syndrome with constipation 02/09/2016   Last Assessment & Plan:  Formatting of this note might be different from the original. 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 Relevant Hx: Course: Daily Update: Today's Plan:2016 dexa with -1.7  t score femur, and taking calcium and vitamin D  Electronically signed by: Krystal Clark, NP 02/09/16 1101   Paget's disease of bone 02/17/2018   Formatting of this note might be different from the original. 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 Relevant  Hx: Course: Daily Update: Today's Plan:there are some issues at home but she is working through those as well as at work and with her mother who is in a nursing home continue with her med  Electronically signed by: Krystal Clark, NP 02/09/16 2139   Recurrent dry cough 02/11/2022   Trochanteric bursitis of left hip 07/06/2020   White matter abnormality on MRI of brain 04/30/2022    Past Surgical History:  Procedure Laterality Date   ABDOMINAL HYSTERECTOMY     BUNIONECTOMY Bilateral    GANGLION CYST EXCISION     Left wrist and Right great toe X2   LAMINECTOMY      Current Medications: Current Meds  Medication Sig   calcium carbonate (CALCIUM 600) 600 MG TABS tablet Take 600 mg by mouth daily.   Cholecalciferol 50 MCG (2000 UT) CAPS Take 2,000 Units by mouth daily.   estradiol (ESTRACE) 0.5 MG tablet Take 0.5 mg by mouth daily.   levothyroxine (SYNTHROID) 50 MCG tablet Take 50 mcg by mouth daily.   lovastatin (MEVACOR) 10 MG tablet Take 1 tablet by mouth daily.   sertraline (ZOLOFT) 100 MG tablet Take 100 mg by mouth daily.   [DISCONTINUED] furosemide (LASIX) 20 MG tablet Take 10 mg by mouth at bedtime.     Allergies:   Patient has no known allergies.   Social History   Socioeconomic History   Marital status: Single    Spouse name: Not on file   Number of children: Not on file   Years of education: Not on file   Highest education level: Not on file  Occupational History   Not on file  Tobacco Use   Smoking status: Never   Smokeless tobacco: Never  Substance and Sexual Activity   Alcohol use: Never   Drug use: Not on file   Sexual activity: Not on file  Other Topics Concern   Not on file  Social History Narrative   Not on file   Social Determinants of Health   Financial Resource Strain: Low Risk  (10/12/2022)   [facility], [facility] prior to 02/22/2023[facility] visits  prior to 02/22/2023.   Overall Financial Resource Strain (CARDIA)    Difficulty of Paying Living Expenses: Not hard at all  Food Insecurity: No Food Insecurity (10/12/2022)   Received from [facility] prior to 02/22/2023., [facility], [facility] visits prior to 02/22/2023., [facility]   Hunger Vital Sign    Worried About Running Out of Food in the Last Year: Never true    Ran Out of Food in the Last Year: Never true  Transportation Needs: No Transportation Needs (10/12/2022)   Received from [facility] visits prior to 02/22/2023., [facility] visits prior to 02/22/2023[facility]   PRAPARE - Transportation    Lack of Transportation (Medical): No    Lack of Transportation (Non-Medical): No  Physical Activity: Insufficiently Active (10/12/2022)   [facility]  visits prior to 02/22/2023., [facility] visits prior to 02/22/2023.   Exercise Vital Sign    Days of Exercise per Week: 2 days    Minutes of Exercise per Session: 40 min  Stress: Stress Concern Present (10/12/2022)   [facility], [facility] prior to 02/22/2023., [facility], [facility] visits prior to 02/22/2023.   [employer] of Occupational Health - Occupational Stress Questionnaire    Feeling of Stress : To some extent  Social Connections: Moderately Isolated (10/12/2022)   [facility], [facility] prior to 02/22/2023., [facility] visits prior to 02/22/2023.   Social Connection and Isolation Panel [NHANES]    Frequency of Communication with Friends and Family: Three times a week    Frequency of Social Gatherings with Friends and Family: Once a week    Attends Religious Services: Never     Database administrator or Organizations: No    Attends Engineer, structural: Never    Marital Status: Married     Family History: The patient's family history includes Breast cancer in her maternal aunt; Breast cancer (age of onset: 42) in her daughter; Colon cancer (age of onset: 29) in her brother; Colon polyps in her father; Heart attack in her father; Liver cancer in her paternal grandmother; Other in her brother; Pancreatic cancer in her maternal grandmother; Skin cancer in her father; Stroke in her mother; Throat cancer in her brother. ROS:   Please see the history of present illness.    All 14 point review of systems negative except as described per history of present illness  EKGs/Labs/Other Studies Reviewed:         Recent Labs: 04/29/2023: NT-Pro BNP 107  Recent Lipid Panel No results found for: "CHOL", "TRIG", "HDL", "CHOLHDL", "VLDL", "LDLCALC", "LDLDIRECT"  Physical Exam:    VS:  BP (!) 158/64 (BP Location: Left Arm, Patient Position: Sitting)   Pulse 61   Ht 5\' 2"  (1.575 m)   Wt 164 lb (74.4 kg)   SpO2 93%   BMI 30.00 kg/m     Wt Readings from Last 3 Encounters:  07/31/23 164 lb (74.4 kg)  04/29/23 162 lb 9.6 oz (73.8 kg)  02/04/23 155 lb (70.3 kg)     GEN:  Well nourished, well developed in no acute distress HEENT: Normal NECK: No JVD; No carotid bruits LYMPHATICS: No lymphadenopathy CARDIAC: RRR, no murmurs, no rubs, no gallops RESPIRATORY:  Clear to auscultation without rales, wheezing or rhonchi  ABDOMEN: Soft, non-tender, non-distended MUSCULOSKELETAL:  No edema; No deformity  SKIN: Warm and dry LOWER EXTREMITIES: no swelling NEUROLOGIC:  Alert and oriented x 3 PSYCHIATRIC:  Normal affect   ASSESSMENT:    1. Aortic atherosclerosis (HCC)   2. Mixed hyperlipidemia    PLAN:    In order of problems listed above:  Essential hypertension still not well-controlled.  I will do give her spironolactone 12.5 daily Chem-7 will be done within a  week.  I am more wondering if may be difficulty controlling her blood pressure can be related to not adequately controlled CPAP/sleep apnea.  I will send a message to our sleep team to see if that can be investigated. Aortic atherosclerosis she is on Mevacor which I will continue I do not have any recent fasting lipid profile we will get a copy of the report.  Medication Adjustments/Labs and Tests Ordered: Current medicines are reviewed at length with the patient today.  Concerns regarding medicines are outlined above.  No orders of the defined types were placed in this encounter.  Medication changes: No orders of the defined types were placed in this encounter.   Signed, Georgeanna Lea, MD, [facility] 07/31/2023 11:39 AM    [facility]

## 2023-07-31 NOTE — Patient Instructions (Addendum)
Medication Instructions:   START: Spironolactone 12.5mg  daily   Lab Work: Your physician recommends that you return for lab work in: 1 week You need to have labs done when you are fasting.  You can come Monday through Friday 8:30 am to 12:00 pm and 1:15 to 4:30. You do not need to make an appointment as the order has already been placed. The labs you are going to have done are BMET.    Testing/Procedures: None Ordered   Follow-Up: At [facility], you and your health needs are our priority.  As part of our continuing mission to provide you with exceptional heart care, we have created designated Provider Care Teams.  These Care Teams include your primary Cardiologist (physician) and Advanced Practice Providers (APPs -  Physician Assistants and Nurse Practitioners) who all work together to provide you with the care you need, when you need it.  We recommend signing up for the patient portal called "MyChart".  Sign up information is provided on this After Visit Summary.  MyChart is used to connect with patients for Virtual Visits (Telemedicine).  Patients are able to view lab/test results, encounter notes, upcoming appointments, etc.  Non-urgent messages can be sent to your provider as well.   To learn more about what you can do with MyChart, go to [email or web address].    Your next appointment:   5 month(s)  The format for your next appointment:   In Person  Provider:   Gypsy Balsam, MD    Other Instructions NA l[

## 2023-07-31 NOTE — Telephone Encounter (Signed)
-----   Message from Nurse Emmit Alexanders sent at 07/31/2023 11:44 AM EDT ----- [place], Dr. Bing Matter is wanting to see if this pts data from her CPAP can be checked to see if her sleep apnea is being controlled adequately. Thank you Dede

## 2023-08-13 LAB — BASIC METABOLIC PANEL: eGFR: 68 mL/min/{1.73_m2} (ref >59)

## 2023-08-14 ENCOUNTER — Telehealth: Payer: Self-pay

## 2023-08-14 NOTE — Telephone Encounter (Signed)
Left message on My Chart with normal results per Dr. Krasowski's note. Routed to PCP. 

## 2023-12-02 ENCOUNTER — Telehealth: Payer: Self-pay | Admitting: Cardiology

## 2023-12-02 NOTE — Telephone Encounter (Signed)
Patient states during her virtual appointment with Dr. Mayford Knife in February she wasn't given any instructions for follow up. She would like to know how soon she needs to be seen or if she still needs to follow up at all in the future excluding any routine CPAP maintenance. Please advise.

## 2023-12-04 NOTE — Telephone Encounter (Signed)
Patient is returning call.  °

## 2023-12-04 NOTE — Telephone Encounter (Signed)
Attempted to call patient, phone disconnected several times, unable to leave message.

## 2023-12-11 ENCOUNTER — Telehealth: Payer: Self-pay | Admitting: Cardiology

## 2023-12-11 NOTE — Telephone Encounter (Signed)
Follow Up:        Patient says she is returning a call from about a week or more. She says, she think it is about her C-Pap.

## 2023-12-12 NOTE — Telephone Encounter (Signed)
Call to patient who is requesting to make one year f/u appt, scheduled 02/23/23.

## 2023-12-26 ENCOUNTER — Encounter: Payer: Self-pay | Admitting: Cardiology

## 2023-12-30 ENCOUNTER — Encounter: Payer: Self-pay | Admitting: Cardiology

## 2023-12-30 ENCOUNTER — Ambulatory Visit: Payer: PPO | Attending: Cardiology | Admitting: Cardiology

## 2023-12-30 VITALS — BP 134/82 | HR 63 | Ht 61.0 in | Wt 168.8 lb

## 2023-12-30 DIAGNOSIS — E559 Vitamin D deficiency, unspecified: Secondary | ICD-10-CM

## 2023-12-30 DIAGNOSIS — E782 Mixed hyperlipidemia: Secondary | ICD-10-CM

## 2023-12-30 DIAGNOSIS — I10 Essential (primary) hypertension: Secondary | ICD-10-CM | POA: Diagnosis not present

## 2023-12-30 DIAGNOSIS — E039 Hypothyroidism, unspecified: Secondary | ICD-10-CM

## 2023-12-30 DIAGNOSIS — R5383 Other fatigue: Secondary | ICD-10-CM

## 2023-12-30 DIAGNOSIS — I700 Atherosclerosis of aorta: Secondary | ICD-10-CM

## 2023-12-30 NOTE — Progress Notes (Signed)
 Cardiology Office Note:    Date:  12/30/2023   ID:  Wendy Diaz, DOB 04-19-1951, MRN [id number]  PCP:  Wendy Wendy Rung, NP  Cardiologist:  Lamar Fitch, MD    Referring MD: Wendy Wendy PARAS*   No chief complaint on file.   History of Present Illness:    Wendy Diaz is a 73 y.o. female past medical history significant for essential hypertension which was difficult to control, obstructive sleep apnea difficulty tolerating CPAP, swelling of lower extremities.  She was given diuretic for swelling of lower extremities however it make swelling worse.  Echocardiogram showed preserved ejection fraction diastolic dysfunction grade 1.  She comes today to months for follow-up swelling is gone.  She is doing quite well from that aspect.  She however complained of being weak tired and exhausted also complained of the fact that she is gaining weight.  She does have hypothyroidism TSH has been checked lately by primary care physician stable.  Overall she seems to be doing quite well.  Past Medical History:  Diagnosis Date   Aortic atherosclerosis (HCC) 04/05/2016   Formatting of this note might be different from the original. Seen on CT of Chest in 03/2016.   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 CT scan March 2023 showed stability 3.7 cm   Episodic recurrent vertigo 10/21/2018   Essential hypertension 02/09/2016   Formatting of this note might be different from the original. signficant difficulty with BP meds and side effects  Last Assessment & Plan:  Formatting of this note might be different from the original. Relevant Hx: Course: Daily Update: Today's Plan:she has checked outside of here and she has better readings will follow  Electronically signed by: Wendy Diaz Benson, NP 02/09/16 2136   Ganglion cyst of right foot 04/08/2017   Genetic testing 11/10/2022   Negative genetics for [facility] Multi-Cancer +RNA Panel.  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  Report date is 09/25/2022.      The Multi-Cancer + RNA Panel offered by [facility] includes sequencing and/or deletion/duplication analysis of the following 84 genes:  AIP*, ALK, APC*, ATM*, AXIN2*, BAP1*, BARD1*, BLM*, BMPR1A*, BRCA1*, BRCA2*, BRIP1*, CASR, CDC73*, CDH1*, CDK4, CDKN1B*, CDKN1C*, CDKN2A,    GERD without esophagitis 02/09/2016   Last Assessment & Plan:  Formatting of this note might be different from the original. Relevant Hx: Course: Daily Update: Today's Plan:this has been stable for her and will follow  Electronically signed by: Wendy Diaz Benson, NP 02/09/16 2136   Hyperthyroidism 09/10/2022   Hypothyroidism (acquired) 02/09/2016   Last Assessment & Plan:  Formatting of this note might be different from the original. Relevant Hx: Course: Daily Update: Today's Plan:update her level on her current dose of med  Electronically signed by: Wendy Diaz Benson, NP 02/09/16 2137   Irritable bowel syndrome with constipation 02/09/2016   Last Assessment & Plan:  Formatting of this note might be different from the original. 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 Relevant Hx: Course: Daily Update: Today's Plan:2016 dexa with -1.7 t score femur, and taking calcium and vitamin D   Electronically signed by: Wendy Merlynn Lady, NP 02/09/16 1101   Paget's disease of bone 02/17/2018    Formatting of this note might be different from the original. 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   Cholecalciferol 50 MCG (2000 UT) CAPS Take 2,000 Units by mouth daily.   estradiol (ESTRACE) 0.5 MG tablet Take 0.5 mg by mouth daily.   finasteride (PROSCAR) 5 MG tablet Take 5 mg by mouth daily.   Fluocinolone Acetonide Scalp 0.01 % OIL Apply 1 Application topically once a week. 2-3 times weekly   levothyroxine (SYNTHROID) 50 MCG tablet Take 50 mcg by mouth daily.   lovastatin (MEVACOR) 10 MG tablet Take 1 tablet by mouth daily.   sertraline (ZOLOFT) 100 MG tablet Take 100 mg by mouth daily.     Allergies:   Patient has no known allergies.   Social History   Socioeconomic History   Marital status: Single    Spouse name: Not on file   Number of children: Not on file   Years of education: Not on file   Highest education level: Not on file  Occupational History   Not on file  Tobacco Use   Smoking status: Never   Smokeless tobacco: Never  Substance and Sexual Activity   Alcohol use: Never   Drug use: Not on file   Sexual activity: Not on file  Other Topics Concern    Not on file  Social History Narrative   Not on file   Social Drivers of Health   Financial Resource Strain: Low Risk  (10/12/2022)   [facility], [facility] prior to 02/22/2023., [facility] visits prior to 02/22/2023.   Overall Financial Resource Strain (CARDIA)    Difficulty of Paying Living Expenses: Not hard at all  Food Insecurity: No Food Insecurity (10/12/2022)   Received from [facility] visits prior to 02/22/2023., [facility] visits prior to 02/22/2023., [facility]   Hunger Vital Sign    Worried About Running Out of Food in the Last Year: Never true    Ran Out of Food in the Last Year: Never true  Transportation Needs: No Transportation Needs (10/12/2022)   Received from [facility] visits prior to 02/22/2023., [facility] visits prior to 02/22/2023[facility]   PRAPARE -  Administrator, Civil Service (Medical): No    Lack of Transportation (Non-Medical): No  Physical Activity: Insufficiently Active (10/12/2022)   [facility] prior to 02/22/2023[facility] visits prior to 02/22/2023.   Exercise Vital Sign    Days of Exercise per Week: 2 days    Minutes of Exercise per Session: 40 min  Stress: Stress Concern Present (10/12/2022)   Received [facility] prior to 02/22/2023., [facility] visits prior to 02/22/2023.   [employer] of Occupational Health - Occupational Stress Questionnaire    Feeling of Stress : To some extent  Social Connections: Moderately Isolated (10/12/2022)   Received from [facility], [facility] prior to 02/22/2023., [facility] visits prior to 02/22/2023.   Social Connection and Isolation Panel [NHANES]    Frequency of Communication with Friends and Family: Three times a week    Frequency of Social Gatherings with Friends and Family: Once a week    Attends Religious Services: Never    Database Administrator or Organizations: No    Attends Engineer, Structural: Never    Marital Status: Married     Family History: The patient's family history includes Breast cancer in her maternal aunt; Breast cancer (age of onset: 52) in her daughter; Colon cancer (age of onset: 82) in her brother; Colon polyps in her father; Heart attack in her father; Liver cancer in her paternal grandmother; Other in her brother; Pancreatic cancer in her maternal grandmother; Skin cancer in her father; Stroke in her mother; Throat cancer in her brother. ROS:   Please see the history of present illness.    All 14 point review of systems negative except as described per history of present illness  EKGs/Labs/Other Studies Reviewed:         Recent Labs: 04/29/2023: NT-Pro BNP 107 08/12/2023: BUN 16; Creatinine, Ser 0.90; Potassium 4.3; Sodium 139  Recent Lipid Panel No results found for: CHOL, TRIG, HDL, CHOLHDL, VLDL, LDLCALC, LDLDIRECT  Physical Exam:    VS:  BP 134/82   Pulse 63   Ht 5' 1 (1.549 m)   Wt 168 lb 12.8 oz (76.6 kg)   SpO2 94%   BMI 31.89 kg/m     Wt Readings from Last 3 Encounters:  12/30/23 168 lb 12.8 oz (76.6 kg)  07/31/23 164 lb (74.4 kg)  04/29/23 162 lb 9.6 oz (73.8 kg)     GEN:  Well nourished, well developed in no acute distress HEENT: Normal NECK: No JVD; No carotid bruits LYMPHATICS: No lymphadenopathy CARDIAC: RRR, no murmurs, no rubs, no gallops RESPIRATORY:  Clear to auscultation without rales, wheezing or rhonchi  ABDOMEN: Soft, non-tender, non-distended MUSCULOSKELETAL:  No edema; No deformity  SKIN: Warm and dry LOWER EXTREMITIES: no  swelling NEUROLOGIC:  Alert and oriented x 3 PSYCHIATRIC:  Normal affect   ASSESSMENT:    1. Essential hypertension   2. Aortic atherosclerosis (HCC)   3. Hypothyroidism (acquired)   4. Mixed hyperlipidemia    PLAN:    In order of problems listed above:  Essential hypertension blood pressure well-controlled continue present management. Aortic atherosclerosis.  She is taking lovastatin however her LDL is still not at target I will double the dose of Mevacor to 20 mg daily, fasting lipid profile, AST LT will be  checked 6 weeks. Hypothyroidism that being addressed by primary care physician stable. Mixed dyslipidemia plan as described above will increase dose of Mevacor. Weakness fatigue I will check B12 and vitamin D3 make sure it is under control.   Medication Adjustments/Labs and Tests Ordered: Current medicines are reviewed at length with the patient today.  Concerns regarding medicines are outlined above.  Orders Placed This Encounter  Procedures   EKG 12-Lead   Medication changes: No orders of the defined types were placed in this encounter.   Signed, Lamar DOROTHA Fitch, MD, [facility] 12/30/2023 8:56 AM    [facility]

## 2023-12-30 NOTE — Patient Instructions (Addendum)
 Medication Instructions:  Your physician recommends that you continue on your current medications as directed. Please refer to the Current Medication list given to you today.  *If you need a refill on your cardiac medications before your next appointment, please call your pharmacy*   Lab Work: B12, D3- today If you have labs (blood work) drawn today and your tests are completely normal, you will receive your results only by: MyChart Message (if you have MyChart) OR A paper copy in the mail If you have any lab test that is abnormal or we need to change your treatment, we will call you to review the results.   Testing/Procedures: None Ordered   Follow-Up: At [facility], you and your health needs are our priority.  As part of our continuing mission to provide you with exceptional heart care, we have created designated Provider Care Teams.  These Care Teams include your primary Cardiologist (physician) and Advanced Practice Providers (APPs -  Physician Assistants and Nurse Practitioners) who all work together to provide you with the care you need, when you need it.  We recommend signing up for the patient portal called MyChart.  Sign up information is provided on this After Visit Summary.  MyChart is used to connect with patients for Virtual Visits (Telemedicine).  Patients are able to view lab/test results, encounter notes, upcoming appointments, etc.  Non-urgent messages can be sent to your provider as well.   To learn more about what you can do with MyChart, go to [email or web address].    Your next appointment:   12 month(s)  The format for your next appointment:   In Person  Provider:   Lamar Fitch, MD    Other Instructions NA

## 2023-12-30 NOTE — Addendum Note (Signed)
 Addended by: Baldo Ash D on: 12/30/2023 09:01 AM   Modules accepted: Orders

## 2023-12-31 LAB — B12 AND FOLATE PANEL
Folate: >20 ng/mL (ref >3.0)
Vitamin B-12: 383 pg/mL (ref 232–1245)

## 2023-12-31 LAB — VITAMIN D 25 HYDROXY (VIT D DEFICIENCY, FRACTURES): Vit D, 25-Hydroxy: 39.8 ng/mL (ref 30.0–100.0)

## 2024-01-05 ENCOUNTER — Telehealth: Payer: Self-pay

## 2024-01-05 NOTE — Telephone Encounter (Signed)
 Left message on My Chart with normal results per Dr. Vanetta Shawl note. Routed to PCP.

## 2024-02-23 ENCOUNTER — Ambulatory Visit: Payer: PPO | Attending: Cardiology | Admitting: Cardiology

## 2024-02-23 ENCOUNTER — Telehealth: Payer: Self-pay | Admitting: *Deleted

## 2024-02-23 ENCOUNTER — Encounter: Payer: Self-pay | Admitting: Cardiology

## 2024-02-23 VITALS — BP 160/98 | HR 57 | Ht 61.0 in | Wt 171.4 lb

## 2024-02-23 DIAGNOSIS — G4733 Obstructive sleep apnea (adult) (pediatric): Secondary | ICD-10-CM

## 2024-02-23 DIAGNOSIS — R0683 Snoring: Secondary | ICD-10-CM

## 2024-02-23 DIAGNOSIS — G4736 Sleep related hypoventilation in conditions classified elsewhere: Secondary | ICD-10-CM

## 2024-02-23 DIAGNOSIS — I10 Essential (primary) hypertension: Secondary | ICD-10-CM

## 2024-02-23 DIAGNOSIS — R400 Somnolence: Secondary | ICD-10-CM

## 2024-02-23 MED ORDER — SALINE NASAL SPRAY 0.65 % NA SOLN: 2.0000 | Freq: Two times a day (BID) | NASAL | 99 refills | Status: AC

## 2024-02-23 NOTE — Progress Notes (Signed)
 Date:  02/23/2024   ID:  Wendy Diaz, DOB [birth year], MRN [id number] The patient was identified using 2 identifiers. PCP:  Krystal Clark, NP   [facility] Providers Cardiologist:  None     Evaluation Performed:  New Patient Evaluation  Chief Complaint:  OSA  History of Present Illness:    Wendy Diaz is a 73 y.o. female with aortic atherosclerosis, hypertension, GERD, hyperlipidemia who was seen by Dr. Bing Matter back in the fall for hypertension.  Due to her hypertension Dr. Bing Matter ordered a sleep study.  Home sleep study showed moderate obstructive sleep apnea with an AHI of 16.4/h and no significant central events.  There was nocturnal hypoxemia with lowest O2 saturation 77%.  She was started on auto CPAP from 4 to 15 cm H2O.  She is now referred to establish care with sleep medicine for treatment of obstructive sleep apnea  At last office visit she was really struggling with feeling too much pressure and so her auto CPAP was decreased to 4 to 12 cm H2O but this was never changed. She also was struggling struggling with her mask so a nasal pillow mask with chin strap was.  She says that it makes some noises at night.  She is now here for follow-up.  She is doing well with her PAP device.  She tolerates the nasal pillow mask and feels the pressure is adequate.  Since going on PAP she feels rested in the am and has no significant daytime sleepiness.  She is having a lot of dry mouth and nose but no nasal congestion.  She does not think that she snores.    Past Medical History:  Diagnosis Date   Aortic atherosclerosis (HCC) 04/05/2016   Formatting of this note might be different from the original. Seen on CT of Chest in 03/2016.   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 CT scan March 2023 showed stability 3.7 cm   Episodic recurrent vertigo 10/21/2018   Essential hypertension 02/09/2016   Formatting of this note might be different from the original. signficant difficulty with BP meds and side effects  Last Assessment & Plan:  Formatting of this note might be different from the original. Relevant Hx: Course: Daily Update: Today's Plan:she has checked outside of here and she has better readings will follow  Electronically signed by: Krystal Clark, NP 02/09/16 2136   Ganglion cyst of right foot 04/08/2017   Genetic testing 11/10/2022   Negative genetics for [facility] Multi-Cancer +RNA Panel.  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  Report date is 09/25/2022.      The Multi-Cancer + RNA Panel offered by [facility] includes sequencing and/or deletion/duplication analysis of the following 84 genes:  AIP*, ALK, APC*, ATM*, AXIN2*, BAP1*, BARD1*, BLM*, BMPR1A*, BRCA1*, BRCA2*, BRIP1*, CASR, CDC73*, CDH1*, CDK4, CDKN1B*, CDKN1C*, CDKN2A,    GERD without esophagitis 02/09/2016   Last Assessment & Plan:  Formatting of this note might be different from the original. Relevant Hx: Course: Daily Update: Today's Plan:this has been stable for her and will follow  Electronically signed by: Krystal Clark, NP 02/09/16 2136   Hyperthyroidism 09/10/2022   Hypothyroidism (acquired) 02/09/2016   Last Assessment & Plan:  Formatting of this note might be different from the original. Relevant Hx: Course:  Daily Update: Today's Plan:update her level on her current dose of med  Electronically signed by: Krystal Clark, NP 02/09/16 2137   Irritable bowel syndrome with constipation 02/09/2016   Last Assessment & Plan:  Formatting of this note might be different from the original. 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 Relevant Hx: Course: Daily Update: Today's Plan:there are some issues at home but she is working through those as well as at work and with her mother who is in a nursing home continue with her med  Electronically signed by: Krystal Clark, NP 02/09/16 2139   Recurrent dry cough 02/11/2022   Trochanteric bursitis of left hip 07/06/2020   White matter abnormality on MRI of brain 04/30/2022   Past Surgical History:  Procedure Laterality Date   ABDOMINAL HYSTERECTOMY     BUNIONECTOMY Bilateral    GANGLION CYST EXCISION     Left wrist and Right great toe X2   LAMINECTOMY       Current Meds  Medication Sig   calcium carbonate (CALCIUM 600) 600 MG TABS tablet Take 600 mg by mouth daily.   Cholecalciferol 50 MCG (2000 UT) CAPS Take 2,000 Units by mouth daily.   estradiol (ESTRACE) 0.5 MG tablet Take 0.5 mg by mouth daily.   finasteride (PROSCAR) 5 MG tablet Take 5 mg by mouth daily.   Fluocinolone Acetonide Scalp 0.01 % OIL Apply 1 Application topically once a week. 2-3 times weekly   levothyroxine (SYNTHROID) 50 MCG tablet Take 50 mcg by mouth daily.   lovastatin (MEVACOR) 10 MG tablet Take 1 tablet by mouth daily.   sertraline (ZOLOFT) 100 MG tablet Take 100 mg by mouth daily.     Allergies:   Patient has no  known allergies.   Social History   Tobacco Use   Smoking status: Never   Smokeless tobacco: Never  Substance Use Topics   Alcohol use: Never     Family Hx: The patient's family history includes Breast cancer in her maternal aunt; Breast cancer (age of onset: 46) in her daughter; Colon cancer (age of onset: 76) in her brother; Colon polyps in her father; Heart attack in her father; Liver cancer in her paternal grandmother; Other in her brother; Pancreatic cancer in her maternal grandmother; Skin cancer in her father; Stroke in her mother; Throat cancer in her brother.  ROS:   Please see the history of present illness.     All other systems reviewed and are negative.   Prior Sleep studies:   The following studies were reviewed today:  HST, PAP compliance download  Labs/Other Tests and Data Reviewed:     Recent Labs: 04/29/2023: NT-Pro BNP 107 08/12/2023:  BUN 16; Creatinine, Ser 0.90; Potassium 4.3; Sodium 139    Wt Readings from Last 3 Encounters:  02/23/24 171 lb 6.4 oz (77.7 kg)  12/30/23 168 lb 12.8 oz (76.6 kg)  07/31/23 164 lb (74.4 kg)     Risk Assessment/Calculations:      STOP-Bang Score:         Objective:    Vital Signs:  BP (!) 160/98   Pulse (!) 57   Ht 5\' 1"  (1.549 m)   Wt 171 lb 6.4 oz (77.7 kg)   SpO2 98%   BMI 32.39 kg/m   GEN: Well nourished, well developed in no acute distress HEENT: Normal NECK: No JVD; No carotid bruits LYMPHATICS: No lymphadenopathy CARDIAC:RRR, no murmurs, rubs, gallops RESPIRATORY:  Clear to auscultation without rales, wheezing or rhonchi  ABDOMEN: Soft, non-tender, non-distended MUSCULOSKELETAL:  No edema; No deformity  SKIN: Warm and dry NEUROLOGIC:  Alert and oriented x 3 PSYCHIATRIC:  Normal affect  ASSESSMENT & PLAN:    OSA - The patient is tolerating PAP therapy well without any problems. The PAP download performed by his DME was personally reviewed and interpreted by me today and showed an AHI of 2.1 /hr on  auto CPAP from 4-15 cm H2O with 100 % compliance in using more than 4 hours nightly.  The patient has been using and benefiting from PAP use and will continue to benefit from therapy.  -add chin strap for mouth dryness -start Nasal saline spray 2 sprays each nostril BID -decrease auto CPAP to 4-12cm H2O to see if she tolerates it better  HTN -BP elevated today on exam -she has not been on any antihypertensive meds -BP at home runs around 139-145/39mmHg   Medication Adjustments/Labs and Tests Ordered: Current medicines are reviewed at length with the patient today.  Concerns regarding medicines are outlined above.   Tests Ordered: No orders of the defined types were placed in this encounter.   Medication Changes: No orders of the defined types were placed in this encounter.   Follow Up:  In Person in 3 month(s)  Signed, Armanda Magic, MD  02/23/2024 2:04 PM    [facility]

## 2024-02-23 NOTE — Telephone Encounter (Signed)
 NEW DME  Upon patient request DME selection is [facility]. Patient understands he will be contacted by [facility] to set up his cpap. Patient understands to call if [facility] does not contact him with new setup in a timely manner. Patient understands they will be called once confirmation has been received from [facility] that they have received their new machine to schedule 10 week follow up appointment.   [facility] notified of new cpap order  Please add to airview Patient was grateful for the call and thanked me.

## 2024-02-23 NOTE — Patient Instructions (Addendum)
 Medication Instructions:  Please use saline nasal spray - 2 sprays twice daily. Continue all other medications as listed.   *If you need a refill on your cardiac medications before your next appointment, please call your pharmacy*  Follow-Up: At [facility], you and your health needs are our priority.  As part of our continuing mission to provide you with exceptional heart care, we have created designated Provider Care Teams.  These Care Teams include your primary Cardiologist (physician) and Advanced Practice Providers (APPs -  Physician Assistants and Nurse Practitioners) who all work together to provide you with the care you need, when you need it.  We recommend signing up for the patient portal called "MyChart".  Sign up information is provided on this After Visit Summary.  MyChart is used to connect with patients for Virtual Visits (Telemedicine).  Patients are able to view lab/test results, encounter notes, upcoming appointments, etc.  Non-urgent messages can be sent to your provider as well.   To learn more about what you can do with MyChart, go to [email or web address].    Your next appointment:   3 month(s)  Provider:   Dr Armanda Magic

## 2024-02-23 NOTE — Addendum Note (Signed)
 Addended by: Sharin Grave on: 02/23/2024 02:11 PM   Modules accepted: Orders

## 2024-02-24 NOTE — Telephone Encounter (Signed)
 Order placed to add chin strap for mouth dryness -decrease auto CPAP to 4-12cm H2O to see if she tolerates it better

## 2024-02-24 NOTE — Addendum Note (Signed)
 Addended by: Reesa Chew on: 02/24/2024 03:41 PM   Modules accepted: Orders

## 2024-03-03 ENCOUNTER — Encounter (HOSPITAL_BASED_OUTPATIENT_CLINIC_OR_DEPARTMENT_OTHER): Payer: Self-pay

## 2024-05-26 ENCOUNTER — Encounter: Payer: Self-pay | Admitting: Cardiology

## 2024-05-26 ENCOUNTER — Ambulatory Visit: Attending: Cardiology | Admitting: Cardiology

## 2024-05-26 VITALS — BP 160/81 | HR 58 | Ht 61.0 in | Wt 162.0 lb

## 2024-05-26 DIAGNOSIS — G4733 Obstructive sleep apnea (adult) (pediatric): Secondary | ICD-10-CM

## 2024-05-26 DIAGNOSIS — I10 Essential (primary) hypertension: Secondary | ICD-10-CM

## 2024-05-26 NOTE — Patient Instructions (Signed)
 Medication Instructions:  Your physician recommends that you continue on your current medications as directed. Please refer to the Current Medication list given to you today.  *If you need a refill on your cardiac medications before your next appointment, please call your pharmacy*  Lab Work: None.  If you have labs (blood work) drawn today and your tests are completely normal, you will receive your results only by: MyChart Message (if you have MyChart) OR A paper copy in the mail If you have any lab test that is abnormal or we need to change your treatment, we will call you to review the results.  Testing/Procedures: None.  Follow-Up: At [facility], you and your health needs are our priority.  As part of our continuing mission to provide you with exceptional heart care, our providers are all part of one team.  This team includes your primary Cardiologist (physician) and Advanced Practice Providers or APPs (Physician Assistants and Nurse Practitioners) who all work together to provide you with the care you need, when you need it.  Your next appointment:   1 year(s)  Provider:   Dr. Gaylyn Keas, MD   We recommend signing up for the patient portal called "MyChart".  Sign up information is provided on this After Visit Summary.  MyChart is used to connect with patients for Virtual Visits (Telemedicine).  Patients are able to view lab/test results, encounter notes, upcoming appointments, etc.  Non-urgent messages can be sent to your provider as well.   To learn more about what you can do with MyChart, go to [email or web address].   Other Instructions Please check your blood pressure twice a day for a week. Check your  blood pressure at lunch and dinner, and write down your readings along with the date, time and a heart rate if possible. Then, bring all your readings in to our front desk. You can also submit over MyChart or call and give the readings over the phone to the  operators.

## 2024-05-26 NOTE — Progress Notes (Signed)
 Date:  05/26/2024   ID:  Wendy Diaz, DOB 01/25/1951, MRN [id number] The patient was identified using 2 identifiers. PCP:  Despina Floro, NP   [facility] Providers Cardiologist:  None      Chief Complaint:  OSA  History of Present Illness:    Wendy Diaz is a 73 y.o. female with aortic atherosclerosis, hypertension, GERD, hyperlipidemia who was seen by Dr. Gordan Latina back in the fall for hypertension.  Due to her hypertension Dr. Krasowski ordered a sleep study.  Home sleep study showed moderate obstructive sleep apnea with an AHI of 16.4/h and no significant central events.  There was nocturnal hypoxemia with lowest O2 saturation 77%.  She was started on auto CPAP from 4 to 15 cm H2O.  She is now referred to establish care with sleep medicine for treatment of obstructive sleep apnea  At last office visit she was really struggling with feeling too much pressure and so her auto CPAP was decreased to 4 to 12 cm H2O and we added nasal saline spray to help with nasal dryness.  Unfortunately the pressure never got changed.   SHe is doing well with her PAP device and has not really had any issues despite the auto CPAP pressure not being decreased.   She tolerates the nasal pillow mask.  She is ok with the current pressure.  Since going on PAP she feels rested in the am some days but some morning she does not feels as rested. She goes to bed at MN and gets up at 9 to 10am unless she has to go to work.  She does get sleepy during the day on the days that she has to get up at 6am to go to work and occasionally naps during the day.   She denies any significant mouth nasal dryness.  SHe has occasional  nasal congestion.  She does not think that she snores.        Past Medical History:  Diagnosis Date   Aortic atherosclerosis (HCC) 04/05/2016   Formatting of this note might be different from the original. Seen on CT of Chest in 03/2016.   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 CT scan March 2023 showed stability 3.7 cm   Episodic recurrent vertigo 10/21/2018   Essential hypertension 02/09/2016   Formatting of this note might be different from the original. signficant difficulty with BP meds and side effects  Last Assessment & Plan:  Formatting of this note might be different from the original. Relevant Hx: Course: Daily Update: Today's Plan:she has checked outside of here and she has better readings will follow  Electronically signed by: Despina Floro, NP 02/09/16 2136   Ganglion cyst of right foot 04/08/2017   Genetic testing 11/10/2022   Negative genetics for [facility] Multi-Cancer +RNA Panel.  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  Report date is 09/25/2022.      The Multi-Cancer + RNA Panel offered by [facility] includes sequencing and/or deletion/duplication analysis of the following 84 genes:  AIP*, ALK, APC*, ATM*, AXIN2*, BAP1*, BARD1*, BLM*, BMPR1A*, BRCA1*, BRCA2*, BRIP1*, CASR, CDC73*, CDH1*, CDK4, CDKN1B*, CDKN1C*, CDKN2A,    GERD without esophagitis 02/09/2016   Last Assessment & Plan:  Formatting of this note might be different from the original. Relevant Hx: Course:  Daily Update: Today's Plan:this has been stable for her and will follow  Electronically signed by: Despina Floro, NP 02/09/16 2136   Hyperthyroidism 09/10/2022   Hypothyroidism (acquired) 02/09/2016   Last Assessment & Plan:  Formatting of this note might be different from the original. Relevant Hx: Course: Daily Update: Today's Plan:update her level on her current dose of med  Electronically signed by: Despina Floro, NP 02/09/16 2137   Irritable bowel syndrome with constipation 02/09/2016   Last Assessment & Plan:  Formatting of this note might be  different from the original. 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 Relevant Hx: Course: Daily Update: Today's Plan:2016 dexa with -1.7 t score femur, and taking calcium and vitamin D   Electronically signed by: Despina Floro, NP 02/09/16 1101   Paget's disease of bone 02/17/2018   Formatting of this note might be different from the original. 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   Cholecalciferol 50 MCG (2000 UT) CAPS Take 2,000 Units by mouth daily.   estradiol (ESTRACE) 0.5 MG tablet Take 0.5 mg by mouth daily.   finasteride (PROSCAR) 5 MG tablet Take 5 mg by mouth daily.   Fluocinolone Acetonide Scalp 0.01 % OIL Apply 1 Application topically once a week. 2-3 times weekly   levothyroxine (SYNTHROID) 50  MCG tablet Take 50 mcg by mouth daily.   lovastatin (MEVACOR) 10 MG tablet Take 1 tablet by mouth daily.   omeprazole (PRILOSEC) 40 MG capsule Take 40 mg by mouth daily.   sertraline (ZOLOFT) 100 MG tablet Take 100 mg by mouth daily.   sodium chloride (HM SALINE NASAL SPRAY) 0.65 % nasal spray Place 2 sprays into the nose in the morning and at bedtime.     Allergies:   Patient has no known allergies.   Social History   Tobacco Use   Smoking status: Never   Smokeless tobacco: Never  Substance Use Topics   Alcohol use: Never     Family Hx: The patient's family history includes Breast cancer in her maternal aunt; Breast cancer (age of onset: 28) in her daughter; Colon cancer (age of onset: 52) in her brother; Colon polyps in her father; Heart attack in her  father; Liver cancer in her paternal grandmother; Other in her brother; Pancreatic cancer in her maternal grandmother; Skin cancer in her father; Stroke in her mother; Throat cancer in her brother.  ROS:   Please see the history of present illness.     All other systems reviewed and are negative.   Prior Sleep studies:   The following studies were reviewed today:  HST, PAP compliance download  Labs/Other Tests and Data Reviewed:     Recent Labs: 08/12/2023: BUN 16; Creatinine, Ser 0.90; Potassium 4.3; Sodium 139    Wt Readings from Last 3 Encounters:  05/26/24 162 lb (73.5 kg)  02/23/24 171 lb 6.4 oz (77.7 kg)  12/30/23 168 lb 12.8 oz (76.6 kg)     Risk Assessment/Calculations:      Objective:    Vital Signs:  BP (!) 160/81 (BP Location: Left Arm, Patient Position: Sitting, Cuff Size: Normal)   Pulse (!) 58   Ht 5\' 1"  (1.549 m)   Wt 162 lb (73.5 kg)   SpO2 94%   BMI 30.61 kg/m   GEN: Well nourished, well developed in no acute distress HEENT: Normal NECK: No JVD; No carotid bruits LYMPHATICS: No lymphadenopathy CARDIAC:RRR, no murmurs, rubs, gallops RESPIRATORY:  Clear to auscultation without rales, wheezing  or rhonchi  ABDOMEN: Soft, non-tender, non-distended MUSCULOSKELETAL:  No edema; No deformity  SKIN: Warm and dry NEUROLOGIC:  Alert and oriented x 3 PSYCHIATRIC:  Normal affect  ASSESSMENT & PLAN:    OSA - The patient is tolerating PAP therapy well without any problems. The PAP download performed by his DME was personally reviewed and interpreted by me today and showed an AHI of 1.1/hr on auto CPAP from 4 to 15 cm H2O with 97% compliance in using more than 4 hours nightly.  The patient has been using and benefiting from PAP use and will continue to benefit from therapy.   HTN -BP not controlled on exam today -she has not been on any antihypertensive meds -I have asked her to check her BP twice daily for a week and call with results   Medication Adjustments/Labs and Tests Ordered: Current medicines are reviewed at length with the patient today.  Concerns regarding medicines are outlined above.   Tests Ordered: No orders of the defined types were placed in this encounter.   Medication Changes: No orders of the defined types were placed in this encounter.   Follow Up:  In Person in 3 month(s)  Signed, Gaylyn Keas, MD  05/26/2024 10:27 AM    [facility] yeah we had not not

## 2024-06-03 DIAGNOSIS — R2241 Localized swelling, mass and lump, right lower limb: Secondary | ICD-10-CM | POA: Insufficient documentation

## 2024-10-14 DIAGNOSIS — M25552 Pain in left hip: Secondary | ICD-10-CM | POA: Insufficient documentation

## 2024-10-14 DIAGNOSIS — G8929 Other chronic pain: Secondary | ICD-10-CM | POA: Insufficient documentation

## 2024-11-04 DIAGNOSIS — M5416 Radiculopathy, lumbar region: Secondary | ICD-10-CM | POA: Insufficient documentation

## 2024-11-19 DIAGNOSIS — M6740 Ganglion, unspecified site: Secondary | ICD-10-CM | POA: Insufficient documentation

## 2025-01-04 ENCOUNTER — Ambulatory Visit: Attending: Cardiology | Admitting: Cardiology

## 2025-01-04 ENCOUNTER — Encounter: Payer: Self-pay | Admitting: Cardiology

## 2025-01-04 VITALS — BP 170/84 | HR 61 | Ht 61.0 in | Wt 163.0 lb

## 2025-01-04 DIAGNOSIS — E782 Mixed hyperlipidemia: Secondary | ICD-10-CM

## 2025-01-04 DIAGNOSIS — Z8249 Family history of ischemic heart disease and other diseases of the circulatory system: Secondary | ICD-10-CM

## 2025-01-04 DIAGNOSIS — R42 Dizziness and giddiness: Secondary | ICD-10-CM | POA: Diagnosis not present

## 2025-01-04 DIAGNOSIS — I10 Essential (primary) hypertension: Secondary | ICD-10-CM

## 2025-01-04 NOTE — Patient Instructions (Addendum)
 Medication Instructions:  Your physician recommends that you continue on your current medications as directed. Please refer to the Current Medication list given to you today.  *If you need a refill on your cardiac medications before your next appointment, please call your pharmacy*   Lab Work: None Ordered If you have labs (blood work) drawn today and your tests are completely normal, you will receive your results only by: MyChart Message (if you have MyChart) OR A paper copy in the mail If you have any lab test that is abnormal or we need to change your treatment, we will call you to review the results.   Testing/Procedures: We will order CT coronary calcium score. It will cost $99.00 and is not covered by insurance.  Please call to schedule.    [facility] [address] [place], [place] [address] [phone]    Follow-Up: At [facility], you and your health needs are our priority.  As part of our continuing mission to provide you with exceptional heart care, we have created designated Provider Care Teams.  These Care Teams include your primary Cardiologist (physician) and Advanced Practice Providers (APPs -  Physician Assistants and Nurse Practitioners) who all work together to provide you with the care you need, when you need it.  We recommend signing up for the patient portal called MyChart.  Sign up information is provided on this After Visit Summary.  MyChart is used to connect with patients for Virtual Visits (Telemedicine).  Patients are able to view lab/test results, encounter notes, upcoming appointments, etc.  Non-urgent messages can be sent to your provider as well.   To learn more about what you can do with MyChart, go to [email or web address].    Your next appointment:   12 month(s)  The format for your next appointment:   In Person  Provider:   Lamar Fitch, MD    Other Instructions NA

## 2025-01-04 NOTE — Progress Notes (Signed)
 " Cardiology Office Note:    Date:  01/04/2025   ID:  QUAMESHA MULLET, DOB [birth year], MRN [id number]  PCP:  Benson Eleanor Rung, NP  Cardiologist:  Lamar Fitch, MD    Referring MD: Benson Eleanor Rung, NP   Chief Complaint  Patient presents with   Annual Exam  Doing fine  History of Present Illness:    Wendy Diaz is a 74 y.o. female past medical history significant for essential hypertension which appears to be difficult to control, obstructive sleep apnea, swelling of lower extremities, dyslipidemia, vertigo.  Comes today to months for follow-up, cardiac wise doing well denies have any chest pain tightness squeezing pressure mid chest.  She does have vertigo she participate in physical therapy she goes to our place here-will call [facility], that seems to be helping a lot.  Otherwise doing fine.  Past Medical History:  Diagnosis Date   Aortic atherosclerosis 04/05/2016   Formatting of this note might be different from the original. Seen on CT of Chest in 03/2016.   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 CT scan March 2023 showed stability 3.7 cm   Episodic recurrent vertigo 10/21/2018   Essential hypertension 02/09/2016   Formatting of this note might be different from the original. signficant difficulty with BP meds and side effects  Last Assessment & Plan:  Formatting of this note might be different from the original. Relevant Hx: Course: Daily Update: Today's Plan:she has checked outside of here and she has better readings will follow  Electronically signed by: Eleanor Rung Benson, NP 02/09/16 2136   Ganglion cyst of right foot 04/08/2017   Genetic testing 11/10/2022   Negative genetics for [facility] Multi-Cancer +RNA Panel.  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   Report date is 09/25/2022.      The Multi-Cancer + RNA Panel offered by [facility] includes sequencing and/or deletion/duplication analysis of the following 84 genes:  AIP*, ALK, APC*, ATM*, AXIN2*, BAP1*, BARD1*, BLM*, BMPR1A*, BRCA1*, BRCA2*, BRIP1*, CASR, CDC73*, CDH1*, CDK4, CDKN1B*, CDKN1C*, CDKN2A,    GERD without esophagitis 02/09/2016   Last Assessment & Plan:  Formatting of this note might be different from the original. Relevant Hx: Course: Daily Update: Today's Plan:this has been stable for her and will follow  Electronically signed by: Eleanor Rung Benson, NP 02/09/16 2136   Hyperthyroidism 09/10/2022   Hypothyroidism (acquired) 02/09/2016   Last Assessment & Plan:  Formatting of this note might be different from the original. Relevant Hx: Course: Daily Update: Today's Plan:update her level on her current dose of med  Electronically signed by: Eleanor Rung Benson, NP 02/09/16 2137   Irritable bowel syndrome with constipation 02/09/2016   Last Assessment & Plan:  Formatting of this note might be different from the original. 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 Relevant Hx:  Course: Daily Update: Today's Plan:there are some issues at home but she is working through those as well as at work and with her mother who is in a nursing home continue with her med  Electronically signed by: Eleanor Merlynn Lady, NP 02/09/16 2139   Recurrent dry cough 02/11/2022   Trochanteric bursitis of left hip 07/06/2020   White matter abnormality on MRI of brain 04/30/2022    Past Surgical History:  Procedure Laterality Date   ABDOMINAL HYSTERECTOMY     BUNIONECTOMY Bilateral    GANGLION CYST EXCISION     Left wrist and Right great toe X2   LAMINECTOMY      Current Medications: Active Medications[1]   Allergies:   Patient has no known allergies.   Social History   Socioeconomic History   Marital status: Single    Spouse name: Not on file   Number of children: Not on file   Years of education: Not on file   Highest education level: Not on file  Occupational History   Not on file  Tobacco Use   Smoking status: Never   Smokeless tobacco: Never  Substance and Sexual Activity   Alcohol use: Never   Drug use: Not on file   Sexual activity: Not on file  Other Topics Concern   Not on file  Social History Narrative   Not on file   Social Drivers of Health   Tobacco Use: Low Risk (01/04/2025)   Patient History    Smoking Tobacco Use: Never    Smokeless Tobacco Use: Never    Passive Exposure: Not on file  Financial Resource Strain: Low Risk (10/12/2022)   Received from [facility]   Overall Financial Resource Strain (CARDIA)    Difficulty of Paying Living Expenses: Not hard at all  Food Insecurity: Low Risk (05/14/2024)   Received from [facility]   Epic    Within the past 12 months, you worried that your food would run out before you got money to buy more: Never true    Within the past 12 months, the food you bought just didn't last and you didn't have money to get more. : Never true  Transportation Needs: No Transportation Needs (05/14/2024)   Received  from [facility]    In the past 12 months, has lack of reliable transportation kept you from medical appointments, meetings, work or from getting things needed for daily living? : No  Physical Activity: Insufficiently Active (10/12/2022)   Received from [facility] visits prior to 02/22/2023., [facility]   Exercise Vital Sign    On average, how many days per week do you engage in moderate to strenuous exercise (like a brisk walk)?: 2 days    On average, how many minutes do you engage in exercise at this level?: 40 min  Stress: Stress Concern Present (10/12/2022)   Received from [facility], [facility] visits prior to 02/22/2023.   [employer] - Occupational Stress Questionnaire    Feeling of Stress : To some extent  Social Connections: Moderately Isolated (10/12/2022)   Received from [facility] visits prior  to 02/22/2023., [facility]   Social Connection and Isolation Panel    In a typical week, how many times do you talk on the phone with family, friends, or neighbors?: Three times a week    How often do you get together with friends or relatives?: Once a week    How often do you attend church or religious services?: Never    Do you belong to any clubs or organizations such as church groups, unions, fraternal or athletic groups, or school groups?: No    How often do you attend meetings of the clubs or organizations you belong to?: Never    Are you married, widowed, divorced, separated, never married, or living with a partner?: Married  Depression (PHQ2-9): Not on file  Alcohol Screen: Not on file  Housing: Low Risk (05/14/2024)   Received from [facility]   Epic    What is your living situation today?: I have a steady place to live    Think about the place you live. Do you have problems with any of the following? Choose all that apply:: None/None on this list  Utilities:  Low Risk (05/14/2024)   Received from [facility]   Utilities    In the past 12 months has the electric, gas, oil, or water company threatened to shut off services in your home? : No  Health Literacy: Not on file     Family History: The patient's family history includes Breast cancer in her maternal aunt; Breast cancer (age of onset: 11) in her daughter; Colon cancer (age of onset: 14) in her brother; Colon polyps in her father; Heart attack in her father; Liver cancer in her paternal grandmother; Other in her brother; Pancreatic cancer in her maternal grandmother; Skin cancer in her father; Stroke in her mother; Throat cancer in her brother. ROS:   Please see the history of present illness.    All 14 point review of systems negative except as described per history of present illness  EKGs/Labs/Other Studies Reviewed:         Recent Labs: No results found for requested labs within last 365 days.  Recent Lipid Panel No results found for: CHOL, TRIG, HDL, CHOLHDL, VLDL, LDLCALC, LDLDIRECT  Physical Exam:    VS:  BP (!) 170/84   Pulse 61   Ht 5' 1 (1.549 m)   Wt 163 lb (73.9 kg)   SpO2 96%   BMI 30.80 kg/m     Wt Readings from Last 3 Encounters:  01/04/25 163 lb (73.9 kg)  05/26/24 162 lb (73.5 kg)  02/23/24 171 lb 6.4 oz (77.7 kg)     GEN:  Well nourished, well developed in no acute distress HEENT: Normal NECK: No JVD; No carotid bruits LYMPHATICS: No lymphadenopathy CARDIAC: RRR, no murmurs, no rubs, no gallops RESPIRATORY:  Clear to auscultation without rales, wheezing or rhonchi  ABDOMEN: Soft, non-tender, non-distended MUSCULOSKELETAL:  No edema; No deformity  SKIN: Warm and dry LOWER EXTREMITIES: no swelling NEUROLOGIC:  Alert and oriented x 3 PSYCHIATRIC:  Normal affect   ASSESSMENT:    1. Essential hypertension   2. Mixed hyperlipidemia   3. Vertigo    PLAN:    In order of problems listed above:  Essential hypertension blood pressure  uncontrolled in the office I asked him to check blood pressure for a week or 2 and bring results to me. Mixed dyslipidemia I did review blood test done by primary care physician in the summertime LDL 94 HDL 47  will do calcium score trying to determine how aggressive we need to be with management of this problem. Vertigo that being followed by physical therapy with good results. We did talk about healthy lifestyle need to exercise on the regular basis   Medication Adjustments/Labs and Tests Ordered: Current medicines are reviewed at length with the patient today.  Concerns regarding medicines are outlined above.  Orders Placed This Encounter  Procedures   EKG 12-Lead   Medication changes: No orders of the defined types were placed in this encounter.   Signed, Lamar DOROTHA Fitch, MD, [facility] 01/04/2025 8:55 AM    [facility]    [1]  Current Meds  Medication Sig   calcium carbonate (CALCIUM 600) 600 MG TABS tablet Take 600 mg by mouth daily.   Cholecalciferol 50 MCG (2000 UT) CAPS Take 2,000 Units by mouth daily.   estradiol (ESTRACE) 0.5 MG tablet Take 0.5 mg by mouth daily.   finasteride (PROSCAR) 5 MG tablet Take 5 mg by mouth daily.   Fluocinolone Acetonide Scalp 0.01 % OIL Apply 1 Application topically once a week. 2-3 times weekly   ketoconazole (NIZORAL) 2 % shampoo Apply 1 Application topically once a week.   levothyroxine (SYNTHROID) 50 MCG tablet Take 50 mcg by mouth daily.   lovastatin (MEVACOR) 10 MG tablet Take 1 tablet by mouth daily.   omeprazole (PRILOSEC) 40 MG capsule Take 40 mg by mouth daily.   sertraline (ZOLOFT) 100 MG tablet Take 100 mg by mouth daily.   sodium chloride (HM SALINE NASAL SPRAY) 0.65 % nasal spray Place 2 sprays into the nose in the morning and at bedtime.   "

## 2025-01-04 NOTE — Addendum Note (Signed)
 Addended by: ARLOA MALLORY D on: 01/04/2025 09:10 AM   Modules accepted: Orders

## 2025-01-07 ENCOUNTER — Ambulatory Visit (HOSPITAL_BASED_OUTPATIENT_CLINIC_OR_DEPARTMENT_OTHER)
Admission: RE | Admit: 2025-01-07 | Discharge: 2025-01-07 | Disposition: A | Discharge status: Home / Self Care | Payer: Self-pay | Source: Ambulatory Visit | Attending: Cardiology | Admitting: Cardiology

## 2025-01-07 DIAGNOSIS — Z8249 Family history of ischemic heart disease and other diseases of the circulatory system: Secondary | ICD-10-CM

## 2025-01-10 ENCOUNTER — Ambulatory Visit: Payer: Self-pay | Admitting: Cardiology

## 2025-01-10 ENCOUNTER — Telehealth: Payer: Self-pay

## 2025-01-10 NOTE — Telephone Encounter (Signed)
 Left message on My Chart with normal results per Dr. Karry note. Routed to PCP.
